# Patient Record
Sex: Female | Born: 1955
Health system: Southern US, Community
[De-identification: ages and names within clinical notes are randomized; demographics above are authoritative.]

## PROBLEM LIST (undated history)

## (undated) DIAGNOSIS — E785 Hyperlipidemia, unspecified: Secondary | ICD-10-CM

## (undated) DIAGNOSIS — I10 Essential (primary) hypertension: Secondary | ICD-10-CM

## (undated) HISTORY — DX: Hyperlipidemia, unspecified: E78.5

## (undated) HISTORY — DX: Essential (primary) hypertension: I10

## (undated) HISTORY — PX: BREAST BIOPSY: SHX20

## (undated) HISTORY — PX: BREAST EXCISIONAL BIOPSY: SUR124

---

## 1990-02-22 HISTORY — PX: BREAST SURGERY: SHX581

## 1997-12-10 ENCOUNTER — Other Ambulatory Visit: Admission: RE | Admit: 1997-12-10 | Discharge: 1997-12-10 | Payer: Self-pay | Admitting: Obstetrics and Gynecology

## 2000-09-15 ENCOUNTER — Other Ambulatory Visit: Admission: RE | Admit: 2000-09-15 | Discharge: 2000-09-15 | Payer: Self-pay | Admitting: Obstetrics and Gynecology

## 2002-11-15 ENCOUNTER — Encounter: Admission: RE | Admit: 2002-11-15 | Discharge: 2002-11-15 | Payer: Self-pay | Admitting: Obstetrics and Gynecology

## 2002-11-15 ENCOUNTER — Encounter: Payer: Self-pay | Admitting: Obstetrics and Gynecology

## 2002-12-13 ENCOUNTER — Other Ambulatory Visit: Admission: RE | Admit: 2002-12-13 | Discharge: 2002-12-13 | Payer: Self-pay | Admitting: *Deleted

## 2002-12-17 ENCOUNTER — Encounter: Admission: RE | Admit: 2002-12-17 | Discharge: 2002-12-17 | Payer: Self-pay | Admitting: *Deleted

## 2003-09-02 ENCOUNTER — Encounter: Admission: RE | Admit: 2003-09-02 | Discharge: 2003-09-02 | Payer: Self-pay | Admitting: Family Medicine

## 2004-12-21 ENCOUNTER — Ambulatory Visit: Payer: Self-pay | Admitting: Family Medicine

## 2005-12-21 ENCOUNTER — Encounter: Admission: RE | Admit: 2005-12-21 | Discharge: 2005-12-21 | Payer: Self-pay | Admitting: Family Medicine

## 2006-01-07 ENCOUNTER — Encounter: Payer: Self-pay | Admitting: Family Medicine

## 2006-02-10 ENCOUNTER — Encounter: Payer: Self-pay | Admitting: Family Medicine

## 2006-09-16 ENCOUNTER — Encounter: Payer: Self-pay | Admitting: Family Medicine

## 2006-12-22 ENCOUNTER — Encounter: Payer: Self-pay | Admitting: Family Medicine

## 2007-07-04 ENCOUNTER — Encounter: Admission: RE | Admit: 2007-07-04 | Discharge: 2007-07-04 | Payer: Self-pay | Admitting: Obstetrics and Gynecology

## 2007-10-12 ENCOUNTER — Encounter: Payer: Self-pay | Admitting: Family Medicine

## 2007-11-03 ENCOUNTER — Encounter: Payer: Self-pay | Admitting: Family Medicine

## 2008-01-12 ENCOUNTER — Encounter: Payer: Self-pay | Admitting: Family Medicine

## 2008-02-05 ENCOUNTER — Encounter: Payer: Self-pay | Admitting: Family Medicine

## 2008-07-23 ENCOUNTER — Encounter: Admission: RE | Admit: 2008-07-23 | Discharge: 2008-07-23 | Payer: Self-pay | Admitting: Obstetrics and Gynecology

## 2008-08-06 ENCOUNTER — Ambulatory Visit: Payer: Self-pay | Admitting: Family Medicine

## 2008-08-06 DIAGNOSIS — I10 Essential (primary) hypertension: Secondary | ICD-10-CM | POA: Insufficient documentation

## 2008-08-06 DIAGNOSIS — E785 Hyperlipidemia, unspecified: Secondary | ICD-10-CM | POA: Insufficient documentation

## 2008-08-19 ENCOUNTER — Encounter (INDEPENDENT_AMBULATORY_CARE_PROVIDER_SITE_OTHER): Payer: Self-pay | Admitting: *Deleted

## 2008-09-04 ENCOUNTER — Ambulatory Visit: Payer: Self-pay | Admitting: Family Medicine

## 2008-09-05 ENCOUNTER — Telehealth (INDEPENDENT_AMBULATORY_CARE_PROVIDER_SITE_OTHER): Payer: Self-pay | Admitting: *Deleted

## 2008-10-14 ENCOUNTER — Telehealth (INDEPENDENT_AMBULATORY_CARE_PROVIDER_SITE_OTHER): Payer: Self-pay | Admitting: *Deleted

## 2008-12-05 ENCOUNTER — Ambulatory Visit: Payer: Self-pay | Admitting: Family Medicine

## 2008-12-06 ENCOUNTER — Encounter: Payer: Self-pay | Admitting: Family Medicine

## 2008-12-06 LAB — CONVERTED CEMR LAB
Bilirubin, Direct: 0 mg/dL (ref 0.0–0.3)
CO2: 30 meq/L (ref 19–32)
Cholesterol: 197 mg/dL (ref 0–200)
GFR calc non Af Amer: 74.84 mL/min (ref >60)
Glucose, Bld: 96 mg/dL (ref 70–99)
LDL Cholesterol: 122 mg/dL — ABNORMAL HIGH (ref 0–99)
Potassium: 4.2 meq/L (ref 3.5–5.1)
Sodium: 139 meq/L (ref 135–145)
Total Bilirubin: 0.5 mg/dL (ref 0.3–1.2)
Total CHOL/HDL Ratio: 3
Total Protein: 6.5 g/dL (ref 6.0–8.3)

## 2008-12-20 ENCOUNTER — Telehealth (INDEPENDENT_AMBULATORY_CARE_PROVIDER_SITE_OTHER): Payer: Self-pay | Admitting: *Deleted

## 2008-12-24 ENCOUNTER — Telehealth: Payer: Self-pay | Admitting: Family Medicine

## 2009-02-11 ENCOUNTER — Ambulatory Visit: Payer: Self-pay | Admitting: Family Medicine

## 2009-02-11 DIAGNOSIS — H9319 Tinnitus, unspecified ear: Secondary | ICD-10-CM | POA: Insufficient documentation

## 2009-02-12 ENCOUNTER — Encounter (INDEPENDENT_AMBULATORY_CARE_PROVIDER_SITE_OTHER): Payer: Self-pay | Admitting: *Deleted

## 2009-02-12 LAB — CONVERTED CEMR LAB
ALT: 41 units/L — ABNORMAL HIGH (ref 0–35)
AST: 34 units/L (ref 0–37)
Albumin: 4.2 g/dL (ref 3.5–5.2)
Alkaline Phosphatase: 49 units/L (ref 39–117)
Basophils Absolute: 0 10*3/uL (ref 0.0–0.1)
Basophils Relative: 0.1 % (ref 0.0–3.0)
Bilirubin, Direct: 0 mg/dL (ref 0.0–0.3)
Chloride: 102 meq/L (ref 96–112)
Creatinine, Ser: 1 mg/dL (ref 0.4–1.2)
Direct LDL: 169.8 mg/dL
Eosinophils Absolute: 0.2 10*3/uL (ref 0.0–0.7)
Glucose, Bld: 102 mg/dL — ABNORMAL HIGH (ref 70–99)
HCT: 40.4 % (ref 36.0–46.0)
HDL: 66.3 mg/dL (ref >39.00)
Hemoglobin: 13.2 g/dL (ref 12.0–15.0)
Lymphocytes Relative: 27.9 % (ref 12.0–46.0)
Monocytes Absolute: 0.3 10*3/uL (ref 0.1–1.0)
Neutro Abs: 3.7 10*3/uL (ref 1.4–7.7)
Platelets: 232 10*3/uL (ref 150.0–400.0)
Potassium: 3.8 meq/L (ref 3.5–5.1)
RBC: 4.5 M/uL (ref 3.87–5.11)
Sodium: 140 meq/L (ref 135–145)
VLDL: 26.4 mg/dL (ref 0.0–40.0)
Vitamin B-12: 1081 pg/mL — ABNORMAL HIGH (ref 211–911)
WBC: 5.8 10*3/uL (ref 4.5–10.5)

## 2009-12-01 ENCOUNTER — Encounter: Payer: Self-pay | Admitting: Family Medicine

## 2009-12-01 ENCOUNTER — Encounter: Admission: RE | Admit: 2009-12-01 | Discharge: 2009-12-01 | Payer: Self-pay | Admitting: Family Medicine

## 2009-12-02 ENCOUNTER — Telehealth: Payer: Self-pay | Admitting: Family Medicine

## 2010-03-22 LAB — CONVERTED CEMR LAB
BUN: 11 mg/dL (ref 6–23)
Bilirubin, Direct: 0 mg/dL (ref 0.0–0.3)
CO2: 30 meq/L (ref 19–32)
Chloride: 109 meq/L (ref 96–112)
Creatinine, Ser: 1 mg/dL (ref 0.4–1.2)
GFR calc non Af Amer: 74.94 mL/min (ref >60)
Glucose, Bld: 103 mg/dL — ABNORMAL HIGH (ref 70–99)
Potassium: 4.3 meq/L (ref 3.5–5.1)
Total Bilirubin: 0.7 mg/dL (ref 0.3–1.2)

## 2010-03-24 NOTE — Progress Notes (Signed)
Summary: FYI   Phone Note Outgoing Call   Call placed by: Almeta Monas CMA Duncan Dull),  December 02, 2009 9:42 AM Call placed to: Patient Details for Reason: Medication Information/FYI Summary of Call: Called pt and advised that the manufacturer no longer recommends that the pt takes the 80mg  zocor and amlodipine together, adv pt we will change her to crestor instead, pt voiced understanding but stated that she just picked up her rx and does not have much information on Crestor, stated she wanted to wait until the 27th of october when she comes in for her appt to discuss with Dr.Lowne and doesn't want to change right now. Adv pt that this is a recommendation from the nmanufacter and a precaution due to possible side effects of the 2 meds. Pt voiced understanding and again decided to wait until her appt to discuss. Initial call taken by: Almeta Monas CMA Duncan Dull),  December 02, 2009 9:52 AM

## 2010-03-24 NOTE — Medication Information (Signed)
Summary: Interaction with Simvastatin & Amlodipine/[facility]  Interaction with Simvastatin & Amlodipine/[facility]   Imported By: Lanelle Bal 12/09/2009 14:50:00  _____________________________________________________________________  External Attachment:    Type:   Image     Comment:   External Document

## 2010-04-23 ENCOUNTER — Encounter (INDEPENDENT_AMBULATORY_CARE_PROVIDER_SITE_OTHER): Payer: Self-pay | Admitting: *Deleted

## 2010-04-30 NOTE — Letter (Signed)
Summary: Primary Care Appointment Letter  [facility]  [address] [place], [place] [address]   Phone: [phone]  Fax: [phone]    04/23/2010 MRN: [id number]     [facility] [address] [place], [place]  [address]      Dear Ms. Jed Limerick,   Your Primary Care Physician Loreen Freud DO has indicated that:    ___X____it is time to schedule an appointment for a physical with fasting labs.    _______you missed your appointment on______ and need to call and          reschedule.    _______you need to have lab work done.    _______you need to schedule an appointment discuss lab or test results.    _______you need to call to reschedule your appointment that is                       scheduled on _________.     Please call our office as soon as possible. Our phone number is [phone]. Please press option 1. Our office is open 8a-5p, Monday through Friday.     Thank you, [facility] Scheduler

## 2010-08-27 ENCOUNTER — Other Ambulatory Visit: Payer: Self-pay | Admitting: Family Medicine

## 2010-10-01 ENCOUNTER — Encounter: Payer: Self-pay | Admitting: Family Medicine

## 2010-10-01 ENCOUNTER — Ambulatory Visit (INDEPENDENT_AMBULATORY_CARE_PROVIDER_SITE_OTHER): Payer: BC Managed Care – PPO | Admitting: Family Medicine

## 2010-10-01 VITALS — BP 130/96 | HR 65 | Temp 98.7°F | Resp 18 | Ht 65.5 in | Wt 162.0 lb

## 2010-10-01 DIAGNOSIS — I10 Essential (primary) hypertension: Secondary | ICD-10-CM

## 2010-10-01 DIAGNOSIS — Z8249 Family history of ischemic heart disease and other diseases of the circulatory system: Secondary | ICD-10-CM

## 2010-10-01 DIAGNOSIS — Z0000 Encounter for general adult medical examination without abnormal findings: Secondary | ICD-10-CM

## 2010-10-01 DIAGNOSIS — R079 Chest pain, unspecified: Secondary | ICD-10-CM

## 2010-10-01 DIAGNOSIS — E785 Hyperlipidemia, unspecified: Secondary | ICD-10-CM

## 2010-10-01 MED ORDER — AMLODIPINE BESYLATE 10 MG PO TABS: 10.0000 mg | ORAL_TABLET | Freq: Every day | ORAL | Status: DC

## 2010-10-01 MED ORDER — NIACIN-SIMVASTATIN ER 1000-40 MG PO TB24: ORAL_TABLET | ORAL | Status: DC

## 2010-10-01 MED ORDER — NIACIN-SIMVASTATIN ER 500-40 MG PO TB24: ORAL_TABLET | ORAL | Status: DC

## 2010-10-01 NOTE — Patient Instructions (Signed)

## 2010-10-01 NOTE — Progress Notes (Signed)
  Subjective:     Kristine Mclaughlin is a 55 y.o. female and is here for a comprehensive physical exam. The patient reports no problems.  History   Social History  . Marital Status: Divorced    Spouse Name: N/A    Number of Children: N/A  . Years of Education: N/A   Occupational History  .  [employer]   Social History Main Topics  . Smoking status: Never Smoker   . Smokeless tobacco: Not on file  . Alcohol Use: 0.6 oz/week    1 Cans of beer per week  . Drug Use: No  . Sexually Active: Yes -- Female partner(s)   Other Topics Concern  . Not on file   Social History Narrative  . No narrative on file   Health Maintenance  Topic Date Due  . Influenza Vaccine  11/23/2010  . Pap Smear  10/01/2011  . Mammogram  12/02/2011  . Tetanus/tdap  01/11/2018  . Colonoscopy  10/01/2018    The following portions of the patient's history were reviewed and updated as appropriate: allergies, current medications, past family history, past medical history, past social history, past surgical history and problem list.  Review of Systems Review of Systems  Constitutional: Negative for activity change, appetite change and fatigue.  HENT: Negative for hearing loss, congestion, tinnitus and ear discharge.  dentist q32m Eyes: Negative for visual disturbance (see optho q1y -- vision corrected to 20/20 with contacts) Respiratory: Negative for cough, chest tightness and shortness of breath.   Cardiovascular: Negative for chest pain, palpitations and leg swelling.  Gastrointestinal: Negative for abdominal pain, diarrhea, constipation and abdominal distention.  Genitourinary: Negative for urgency, frequency, decreased urine volume and difficulty urinating.  Musculoskeletal: Negative for back pain, arthralgias and gait problem.  Skin: Negative for color change, pallor and rash.  Neurological: Negative for dizziness, light-headedness, numbness and headaches.  Hematological: Negative for adenopathy. Does not  bruise/bleed easily.  Psychiatric/Behavioral: Negative for suicidal ideas, confusion, sleep disturbance, self-injury, dysphoric mood, decreased concentration and agitation.       Objective:    BP 130/96  Pulse 65  Temp(Src) 98.7 F (37.1 C) (Oral)  Resp 18  Ht 5' 5.5" (1.664 m)  Wt 162 lb (73.483 kg)  BMI 26.55 kg/m2  SpO2 99% General appearance: alert, cooperative, appears stated age and no distress Head: Normocephalic, without obvious abnormality, atraumatic Eyes: conjunctivae/corneas clear. PERRL, EOM's intact. Fundi benign. Ears: normal TM's and external ear canals both ears Nose: Nares normal. Septum midline. Mucosa normal. No drainage or sinus tenderness. Throat: lips, mucosa, and tongue normal; teeth and gums normal Neck: no adenopathy, no carotid bruit, no JVD, supple, symmetrical, trachea midline and thyroid not enlarged, symmetric, no tenderness/mass/nodules Back: symmetric, no curvature. ROM normal. No CVA tenderness. Lungs: clear to auscultation bilaterally Breasts: gyn Heart: regular rate and rhythm, S1, S2 normal, no murmur, click, rub or gallop Abdomen: soft, non-tender; bowel sounds normal; no masses,  no organomegaly Pelvic: gyn Extremities: extremities normal, atraumatic, no cyanosis or edema Pulses: 2+ and symmetric Skin: Skin color, texture, turgor normal. No rashes or lesions Lymph nodes: Cervical, supraclavicular, and axillary nodes normal. Neurologic: Alert and oriented X 3, normal strength and tone. Normal symmetric reflexes. Normal coordination and gait    Assessment:    Healthy female exam. Hyperlipidemia htn     Plan:    check fasting labs ghm utd See After Visit Summary for Counseling Recommendations

## 2010-10-05 ENCOUNTER — Telehealth: Payer: Self-pay | Admitting: Family Medicine

## 2010-10-05 NOTE — Telephone Encounter (Signed)
In reference to your Order for Rest Stress Myoview, [employer] Nurse reviewer was unable to approve by phone.  A peer to peer is required using patient's ID# [id number], and calling [phone], Opt 2, and will stay open for only 3 business days.

## 2010-10-06 ENCOUNTER — Other Ambulatory Visit: Payer: Self-pay | Admitting: Family Medicine

## 2010-10-06 NOTE — Telephone Encounter (Signed)
Norvasc Last filled #[phone]  Please advise if Pt is to be on both med.

## 2010-10-06 NOTE — Telephone Encounter (Signed)
Did they say why they wouldn't cover it?  Will they cover a treadmill ?----I haven't been able to call during their preferred hours.  We can change it to treadmill if they will cover that.  I will not be in the office tomorrow.  Back on Thursday.

## 2010-10-08 ENCOUNTER — Other Ambulatory Visit (INDEPENDENT_AMBULATORY_CARE_PROVIDER_SITE_OTHER): Payer: BC Managed Care – PPO

## 2010-10-08 ENCOUNTER — Encounter: Payer: Self-pay | Admitting: Family Medicine

## 2010-10-08 DIAGNOSIS — R319 Hematuria, unspecified: Secondary | ICD-10-CM

## 2010-10-08 DIAGNOSIS — Z0000 Encounter for general adult medical examination without abnormal findings: Secondary | ICD-10-CM

## 2010-10-08 LAB — CBC WITH DIFFERENTIAL/PLATELET
Basophils Relative: 0.4 % (ref 0.0–3.0)
Eosinophils Absolute: 0.1 10*3/uL (ref 0.0–0.7)
Hemoglobin: 12.8 g/dL (ref 12.0–15.0)
MCHC: 32.5 g/dL (ref 30.0–36.0)
Neutrophils Relative %: 64.9 % (ref 43.0–77.0)
WBC: 5.3 10*3/uL (ref 4.5–10.5)

## 2010-10-08 LAB — BASIC METABOLIC PANEL
BUN: 11 mg/dL (ref 6–23)
CO2: 27 mEq/L (ref 19–32)
Calcium: 8.9 mg/dL (ref 8.4–10.5)
Creatinine, Ser: 0.8 mg/dL (ref 0.4–1.2)
GFR: 100.47 mL/min (ref >60.00)
Glucose, Bld: 104 mg/dL — ABNORMAL HIGH (ref 70–99)
Sodium: 137 mEq/L (ref 135–145)

## 2010-10-08 LAB — POCT URINALYSIS DIPSTICK
Bilirubin, UA: NEGATIVE
Ketones, UA: NEGATIVE
Leukocytes, UA: NEGATIVE
Protein, UA: NEGATIVE
Spec Grav, UA: 1.02

## 2010-10-08 LAB — HEPATIC FUNCTION PANEL
ALT: 20 U/L (ref 0–35)
AST: 20 U/L (ref 0–37)
Albumin: 4.4 g/dL (ref 3.5–5.2)
Bilirubin, Direct: <0.1 mg/dL (ref 0.0–0.3)
Total Bilirubin: 0.5 mg/dL (ref 0.3–1.2)
Total Protein: 7.3 g/dL (ref 6.0–8.3)

## 2010-10-08 LAB — LIPID PANEL
Cholesterol: 226 mg/dL — ABNORMAL HIGH (ref 0–200)
HDL: 70.2 mg/dL (ref >39.00)
Total CHOL/HDL Ratio: 3
Triglycerides: 94 mg/dL (ref 0.0–149.0)

## 2010-10-08 LAB — LDL CHOLESTEROL, DIRECT: Direct LDL: 165.3 mg/dL

## 2010-10-08 NOTE — Progress Notes (Signed)
Labs only

## 2010-10-08 NOTE — Telephone Encounter (Signed)
Rx sent 

## 2010-10-09 ENCOUNTER — Other Ambulatory Visit: Payer: Self-pay | Admitting: Family Medicine

## 2010-10-09 ENCOUNTER — Encounter: Payer: Self-pay | Admitting: Family Medicine

## 2010-10-09 DIAGNOSIS — Z8249 Family history of ischemic heart disease and other diseases of the circulatory system: Secondary | ICD-10-CM

## 2010-10-09 NOTE — Telephone Encounter (Signed)
No Pre-Cert required for Treadmill (Ref# [id number]).  Please enter Order as EXC or Treadmill, should pull up the correct test.

## 2010-10-12 ENCOUNTER — Other Ambulatory Visit: Payer: Self-pay | Admitting: Family Medicine

## 2010-10-12 DIAGNOSIS — Z8249 Family history of ischemic heart disease and other diseases of the circulatory system: Secondary | ICD-10-CM

## 2010-10-13 ENCOUNTER — Encounter: Payer: Self-pay | Admitting: Family Medicine

## 2010-10-15 ENCOUNTER — Encounter: Payer: Self-pay | Admitting: Family Medicine

## 2010-10-15 ENCOUNTER — Ambulatory Visit (INDEPENDENT_AMBULATORY_CARE_PROVIDER_SITE_OTHER): Payer: BC Managed Care – PPO | Admitting: Family Medicine

## 2010-10-15 VITALS — BP 116/82 | HR 83 | Temp 98.4°F | Wt 161.6 lb

## 2010-10-15 DIAGNOSIS — I10 Essential (primary) hypertension: Secondary | ICD-10-CM

## 2010-10-15 DIAGNOSIS — G2581 Restless legs syndrome: Secondary | ICD-10-CM

## 2010-10-15 MED ORDER — BENAZEPRIL HCL 20 MG PO TABS: 20.0000 mg | ORAL_TABLET | Freq: Every day | ORAL | Status: DC

## 2010-10-15 MED ORDER — ROPINIROLE HCL 0.25 MG PO TABS: ORAL_TABLET | ORAL | Status: DC

## 2010-10-15 MED ORDER — AMLODIPINE BESYLATE 10 MG PO TABS: 10.0000 mg | ORAL_TABLET | Freq: Every day | ORAL | Status: DC

## 2010-10-15 NOTE — Patient Instructions (Signed)

## 2010-10-15 NOTE — Progress Notes (Signed)
  Subjective:    Patient here for follow-up of elevated blood pressure.  She is exercising and is adherent to a low-salt diet.  Blood pressure is well controlled at home. Cardiac symptoms: none. Patient denies: chest pain, chest pressure/discomfort, claudication, dyspnea, exertional chest pressure/discomfort, fatigue, irregular heart beat, lower extremity edema, near-syncope, orthopnea, palpitations, paroxysmal nocturnal dyspnea, syncope and tachypnea. Cardiovascular risk factors: dyslipidemia, family history of premature cardiovascular disease and hypertension. Use of agents associated with hypertension: none. History of target organ damage: none.  The following portions of the patient's history were reviewed and updated as appropriate: allergies, current medications, past family history, past medical history, past social history, past surgical history and problem list.  Review of Systems Pertinent items are noted in HPI.     Objective:    BP 116/82  Pulse 83  Temp(Src) 98.4 F (36.9 C) (Oral)  Wt 161 lb 9.6 oz (73.301 kg)  SpO2 98% General appearance: alert, cooperative, appears stated age and no distress Lungs: clear to auscultation bilaterally Heart: regular rate and rhythm, S1, S2 normal, no murmur, click, rub or gallop Extremities: extremities normal, atraumatic, no cyanosis or edema    Assessment:    Hypertension, normal blood pressure . Evidence of target organ damage: none.    Plan:    Medication: no change. Dietary sodium restriction. Regular aerobic exercise. Follow up: 3 months and as needed.

## 2010-10-20 NOTE — Progress Notes (Signed)
mssg left on VM     KP 

## 2010-10-27 ENCOUNTER — Telehealth: Payer: Self-pay | Admitting: Family Medicine

## 2010-10-27 NOTE — Telephone Encounter (Signed)
was this an Error?

## 2010-10-27 NOTE — Telephone Encounter (Signed)
I'm sorry I can't tell what this is asking for.

## 2010-10-29 ENCOUNTER — Other Ambulatory Visit: Payer: BC Managed Care – PPO

## 2010-10-29 ENCOUNTER — Encounter: Payer: BC Managed Care – PPO | Admitting: Physician Assistant

## 2010-11-03 ENCOUNTER — Other Ambulatory Visit: Payer: Self-pay | Admitting: *Deleted

## 2010-11-03 ENCOUNTER — Telehealth: Payer: Self-pay | Admitting: *Deleted

## 2010-11-03 NOTE — Telephone Encounter (Signed)
Prior auth approved 10-30-10-07-25-13, pharmacy notified via fax.

## 2010-11-03 NOTE — Telephone Encounter (Signed)
Pharmacy called informing dr Laury Axon that per recommendation simcor 1000-20 is the reccommended dose when Pt is on norvasc. Pt is currently being Rx simcor 1000-40. Please advise

## 2010-11-03 NOTE — Telephone Encounter (Signed)
It was my understanding that if the pt has been on that combo for more than a year its ok to leave it---i actually lowered zocor from 80 to 40.

## 2010-11-04 ENCOUNTER — Other Ambulatory Visit: Payer: Self-pay | Admitting: Family Medicine

## 2010-11-04 DIAGNOSIS — N390 Urinary tract infection, site not specified: Secondary | ICD-10-CM

## 2010-11-04 NOTE — Telephone Encounter (Signed)
Discussed with Pharmacist and she voiced understanding of Dr.Lowne recommendations     KP

## 2010-11-05 ENCOUNTER — Other Ambulatory Visit (INDEPENDENT_AMBULATORY_CARE_PROVIDER_SITE_OTHER): Payer: BC Managed Care – PPO

## 2010-11-05 DIAGNOSIS — N390 Urinary tract infection, site not specified: Secondary | ICD-10-CM

## 2010-11-05 LAB — POCT URINALYSIS DIPSTICK
Glucose, UA: NEGATIVE
Spec Grav, UA: 1.005
Urobilinogen, UA: 0.2
pH, UA: 7.5

## 2010-11-05 NOTE — Progress Notes (Signed)
Labs only

## 2010-11-06 NOTE — Progress Notes (Signed)
Labs only

## 2010-11-07 LAB — URINE CULTURE

## 2010-11-17 ENCOUNTER — Encounter: Payer: Self-pay | Admitting: Family Medicine

## 2010-11-20 ENCOUNTER — Telehealth: Payer: Self-pay

## 2010-11-20 MED ORDER — ATORVASTATIN CALCIUM 20 MG PO TABS: 20.0000 mg | ORAL_TABLET | Freq: Every day | ORAL | Status: DC

## 2010-11-20 NOTE — Telephone Encounter (Signed)
mssg from patient stating that Simcor was too expensive, she stated even after using the discount card she will be paying 167 dollars and that is too much money for this med, she wants to know if she could change to something else. Please advise     KP

## 2010-11-20 NOTE — Telephone Encounter (Signed)
Lipitor 20 mg #30  1 po qhs, 2 refills 

## 2010-11-20 NOTE — Telephone Encounter (Signed)
VM left advising patient new Rx has been sent to the pharmacy     KP

## 2011-01-13 ENCOUNTER — Encounter: Payer: Self-pay | Admitting: Family Medicine

## 2011-01-21 ENCOUNTER — Ambulatory Visit: Payer: BC Managed Care – PPO | Admitting: Family Medicine

## 2011-02-18 ENCOUNTER — Other Ambulatory Visit: Payer: Self-pay | Admitting: Family Medicine

## 2011-04-01 ENCOUNTER — Encounter: Payer: Self-pay | Admitting: Family Medicine

## 2011-04-01 ENCOUNTER — Ambulatory Visit (INDEPENDENT_AMBULATORY_CARE_PROVIDER_SITE_OTHER): Payer: BC Managed Care – PPO | Admitting: Family Medicine

## 2011-04-01 VITALS — BP 114/70 | HR 71 | Temp 98.4°F | Wt 162.0 lb

## 2011-04-01 DIAGNOSIS — E785 Hyperlipidemia, unspecified: Secondary | ICD-10-CM

## 2011-04-01 DIAGNOSIS — R739 Hyperglycemia, unspecified: Secondary | ICD-10-CM

## 2011-04-01 DIAGNOSIS — R319 Hematuria, unspecified: Secondary | ICD-10-CM

## 2011-04-01 DIAGNOSIS — R7309 Other abnormal glucose: Secondary | ICD-10-CM

## 2011-04-01 DIAGNOSIS — I10 Essential (primary) hypertension: Secondary | ICD-10-CM

## 2011-04-01 LAB — HEPATIC FUNCTION PANEL
AST: 20 U/L (ref 0–37)
Albumin: 4.1 g/dL (ref 3.5–5.2)
Alkaline Phosphatase: 56 U/L (ref 39–117)
Total Protein: 7.3 g/dL (ref 6.0–8.3)

## 2011-04-01 LAB — BASIC METABOLIC PANEL
BUN: 12 mg/dL (ref 6–23)
Creatinine, Ser: 1 mg/dL (ref 0.4–1.2)
GFR: 78.7 mL/min (ref >60.00)
Glucose, Bld: 107 mg/dL — ABNORMAL HIGH (ref 70–99)
Potassium: 4.1 mEq/L (ref 3.5–5.1)

## 2011-04-01 LAB — LDL CHOLESTEROL, DIRECT: Direct LDL: 165.3 mg/dL

## 2011-04-01 LAB — LIPID PANEL
Cholesterol: 244 mg/dL — ABNORMAL HIGH (ref 0–200)
HDL: 72.3 mg/dL (ref >39.00)

## 2011-04-01 LAB — POCT URINALYSIS DIPSTICK
Glucose, UA: NEGATIVE
Ketones, UA: NEGATIVE

## 2011-04-01 NOTE — Patient Instructions (Signed)

## 2011-04-01 NOTE — Progress Notes (Signed)
  Subjective:    Patient here for follow-up of elevated blood pressure.  She is exercising and is adherent to a low-salt diet.  Blood pressure is well controlled at home. Cardiac symptoms: none. Patient denies: chest pain, chest pressure/discomfort, claudication, dyspnea, exertional chest pressure/discomfort, fatigue, irregular heart beat, lower extremity edema, near-syncope, orthopnea, palpitations, paroxysmal nocturnal dyspnea, syncope and tachypnea. Cardiovascular risk factors: none. Use of agents associated with hypertension: none. History of target organ damage: none.  The following portions of the patient's history were reviewed and updated as appropriate: allergies, current medications, past family history, past medical history, past social history, past surgical history and problem list.  Review of Systems Pertinent items are noted in HPI.     Objective:    BP 114/70  Pulse 71  Temp(Src) 98.4 F (36.9 C) (Oral)  Wt 162 lb (73.483 kg)  SpO2 98% General appearance: alert, cooperative, appears stated age and no distress Lungs: clear to auscultation bilaterally Heart: S1, S2 normal Extremities: extremities normal, atraumatic, no cyanosis or edema    Assessment:    Hypertension, normal blood pressure . Evidence of target organ damage: none.   hyperlipidemia-- check labs con't meds  hyperglycemia--- con't watch diet and exercise,  Check labs Plan:    Medication: no change. Dietary sodium restriction. Regular aerobic exercise. Check blood pressures 2-3 times weekly and record. Follow up: 6 months and as needed.

## 2011-04-01 NOTE — Progress Notes (Signed)
Addended by: Legrand Como on: 04/01/2011 11:08 AM   Modules accepted: Orders

## 2011-04-03 LAB — URINE CULTURE
Colony Count: NO GROWTH
Organism ID, Bacteria: NO GROWTH

## 2011-04-07 MED ORDER — ATORVASTATIN CALCIUM 40 MG PO TABS: 40.0000 mg | ORAL_TABLET | Freq: Every day | ORAL | Status: DC

## 2011-07-17 ENCOUNTER — Other Ambulatory Visit: Payer: Self-pay | Admitting: Family Medicine

## 2011-07-20 ENCOUNTER — Other Ambulatory Visit: Payer: Self-pay

## 2011-07-20 MED ORDER — ATORVASTATIN CALCIUM 40 MG PO TABS: 40.0000 mg | ORAL_TABLET | Freq: Every day | ORAL | Status: DC

## 2011-08-16 ENCOUNTER — Other Ambulatory Visit: Payer: Self-pay | Admitting: Family Medicine

## 2011-08-31 ENCOUNTER — Telehealth: Payer: Self-pay | Admitting: Family Medicine

## 2011-08-31 DIAGNOSIS — E785 Hyperlipidemia, unspecified: Secondary | ICD-10-CM

## 2011-08-31 DIAGNOSIS — Z1231 Encounter for screening mammogram for malignant neoplasm of breast: Secondary | ICD-10-CM

## 2011-08-31 NOTE — Telephone Encounter (Signed)
msg left to be sure that patient was not having any breast problems before ordering Mammogram.     KP

## 2011-08-31 NOTE — Telephone Encounter (Signed)
labs due coming Thursday - Recheck labs in 3 months---.272.4 Lipid, hep, (can you put in orders) patient also would like a referral for a mammogram as well. OK To leave a detailed message on cell # [phone]

## 2011-09-02 ENCOUNTER — Ambulatory Visit
Admission: RE | Admit: 2011-09-02 | Discharge: 2011-09-02 | Disposition: A | Discharge status: Home / Self Care | Payer: BC Managed Care – PPO | Source: Ambulatory Visit | Attending: Family Medicine | Admitting: Family Medicine

## 2011-09-02 ENCOUNTER — Other Ambulatory Visit (INDEPENDENT_AMBULATORY_CARE_PROVIDER_SITE_OTHER): Payer: BC Managed Care – PPO

## 2011-09-02 DIAGNOSIS — E785 Hyperlipidemia, unspecified: Secondary | ICD-10-CM

## 2011-09-02 DIAGNOSIS — Z1231 Encounter for screening mammogram for malignant neoplasm of breast: Secondary | ICD-10-CM

## 2011-09-02 LAB — LIPID PANEL
Cholesterol: 229 mg/dL — ABNORMAL HIGH (ref 0–200)
HDL: 79.9 mg/dL (ref >39.00)
Triglycerides: 83 mg/dL (ref 0.0–149.0)
VLDL: 16.6 mg/dL (ref 0.0–40.0)

## 2011-09-02 LAB — HEPATIC FUNCTION PANEL
Albumin: 4.3 g/dL (ref 3.5–5.2)
Total Protein: 7.6 g/dL (ref 6.0–8.3)

## 2011-09-16 ENCOUNTER — Other Ambulatory Visit: Payer: Self-pay | Admitting: Family Medicine

## 2011-12-13 ENCOUNTER — Other Ambulatory Visit: Payer: Self-pay | Admitting: Family Medicine

## 2011-12-13 DIAGNOSIS — I10 Essential (primary) hypertension: Secondary | ICD-10-CM

## 2011-12-13 MED ORDER — BENAZEPRIL HCL 20 MG PO TABS: 20.0000 mg | ORAL_TABLET | Freq: Every day | ORAL | Status: DC

## 2011-12-13 MED ORDER — AMLODIPINE BESYLATE 10 MG PO TABS: 10.0000 mg | ORAL_TABLET | Freq: Every day | ORAL | Status: DC

## 2011-12-13 NOTE — Telephone Encounter (Signed)
REFILLS X 2---last ov 2.7.13-follow up 6-months  1-Benazepril HCl (Tab) 20 MG Take 1 tablet (20 mg total) by mouth daily. #90 last fill 3.30.13  2-AmLODIPine Besylate (Tab) 10 MG Take 1 tablet (10 mg total) by mouth daily #90--last fill 03.30.13

## 2012-01-13 ENCOUNTER — Telehealth: Payer: Self-pay | Admitting: Family Medicine

## 2012-01-13 NOTE — Telephone Encounter (Signed)
Refill-atorvastatin 40mg  tablet. Take one tablet by mouth daily. Qty 30 last fill 11.20.13

## 2012-03-20 ENCOUNTER — Other Ambulatory Visit: Payer: Self-pay | Admitting: Family Medicine

## 2012-03-20 NOTE — Telephone Encounter (Signed)
Last seen 04/01/11 and labs done 09/02/11, No pending apts.      KP

## 2012-03-20 NOTE — Telephone Encounter (Signed)
Pt needs ov. 

## 2012-03-20 NOTE — Telephone Encounter (Signed)
refill  Atorvastatin (Tab) 40 MG Take 1 tablet (40 mg total) by mouth daily #30 last fill 12.26.13

## 2012-03-20 NOTE — Telephone Encounter (Signed)
Msg left for a return call.    KP 

## 2012-03-24 MED ORDER — ATORVASTATIN CALCIUM 40 MG PO TABS: 40.0000 mg | ORAL_TABLET | Freq: Every day | ORAL | Status: DC

## 2012-04-08 ENCOUNTER — Other Ambulatory Visit: Payer: Self-pay

## 2012-04-18 ENCOUNTER — Other Ambulatory Visit: Payer: Self-pay | Admitting: Family Medicine

## 2012-04-22 ENCOUNTER — Other Ambulatory Visit: Payer: Self-pay | Admitting: Family Medicine

## 2012-05-22 ENCOUNTER — Other Ambulatory Visit: Payer: Self-pay | Admitting: Family Medicine

## 2012-06-15 ENCOUNTER — Encounter: Payer: BC Managed Care – PPO | Admitting: Family Medicine

## 2012-06-15 ENCOUNTER — Encounter: Payer: Self-pay | Admitting: Family Medicine

## 2012-06-15 ENCOUNTER — Ambulatory Visit (INDEPENDENT_AMBULATORY_CARE_PROVIDER_SITE_OTHER): Payer: BC Managed Care – PPO | Admitting: Family Medicine

## 2012-06-15 VITALS — BP 118/84 | HR 86 | Temp 98.6°F | Wt 165.6 lb

## 2012-06-15 DIAGNOSIS — Z0000 Encounter for general adult medical examination without abnormal findings: Secondary | ICD-10-CM

## 2012-06-15 DIAGNOSIS — E785 Hyperlipidemia, unspecified: Secondary | ICD-10-CM

## 2012-06-15 DIAGNOSIS — I10 Essential (primary) hypertension: Secondary | ICD-10-CM

## 2012-06-15 DIAGNOSIS — R319 Hematuria, unspecified: Secondary | ICD-10-CM

## 2012-06-15 LAB — CBC WITH DIFFERENTIAL/PLATELET
Basophils Absolute: 0 10*3/uL (ref 0.0–0.1)
Eosinophils Relative: 3.1 % (ref 0.0–5.0)
MCV: 86.3 fl (ref 78.0–100.0)
Monocytes Absolute: 0.3 10*3/uL (ref 0.1–1.0)
Neutrophils Relative %: 56.8 % (ref 43.0–77.0)
Platelets: 250 10*3/uL (ref 150.0–400.0)
RDW: 13.7 % (ref 11.5–14.6)
WBC: 5.8 10*3/uL (ref 4.5–10.5)

## 2012-06-15 LAB — BASIC METABOLIC PANEL
BUN: 13 mg/dL (ref 6–23)
Chloride: 105 mEq/L (ref 96–112)
Creatinine, Ser: 0.9 mg/dL (ref 0.4–1.2)
GFR: 79.32 mL/min (ref >60.00)

## 2012-06-15 LAB — POCT URINALYSIS DIPSTICK
Glucose, UA: NEGATIVE
Ketones, UA: NEGATIVE
Spec Grav, UA: 1.02

## 2012-06-15 LAB — HEPATIC FUNCTION PANEL
ALT: 25 U/L (ref 0–35)
Bilirubin, Direct: 0 mg/dL (ref 0.0–0.3)
Total Bilirubin: 0.3 mg/dL (ref 0.3–1.2)

## 2012-06-15 LAB — TSH: TSH: 0.38 u[IU]/mL (ref 0.35–5.50)

## 2012-06-15 MED ORDER — ATORVASTATIN CALCIUM 40 MG PO TABS: 40.0000 mg | ORAL_TABLET | Freq: Every day | ORAL | Status: DC

## 2012-06-15 MED ORDER — BENAZEPRIL HCL 20 MG PO TABS: 20.0000 mg | ORAL_TABLET | Freq: Every day | ORAL | Status: DC

## 2012-06-15 NOTE — Addendum Note (Signed)
Addended by: Silvio Pate D on: 06/15/2012 03:13 PM   Modules accepted: Orders

## 2012-06-15 NOTE — Progress Notes (Signed)
  Subjective:    Patient ID: Kristine Mclaughlin, female    DOB: 05-31-1955, 57 y.o.   MRN: [id number]  HPI  No charge-- lab only appointment--physical next month   Review of Systems     Objective:   Physical Exam        Assessment & Plan:

## 2012-06-17 LAB — URINE CULTURE: Colony Count: >=100000

## 2012-06-22 ENCOUNTER — Other Ambulatory Visit: Payer: Self-pay

## 2012-06-22 MED ORDER — CIPROFLOXACIN HCL 500 MG PO TABS: 500.0000 mg | ORAL_TABLET | Freq: Two times a day (BID) | ORAL | Status: DC

## 2012-07-11 ENCOUNTER — Encounter: Payer: BC Managed Care – PPO | Admitting: Family Medicine

## 2012-07-13 ENCOUNTER — Other Ambulatory Visit: Payer: Self-pay | Admitting: Family Medicine

## 2012-07-26 ENCOUNTER — Other Ambulatory Visit: Payer: Self-pay | Admitting: Family Medicine

## 2012-08-24 ENCOUNTER — Ambulatory Visit (INDEPENDENT_AMBULATORY_CARE_PROVIDER_SITE_OTHER): Payer: BC Managed Care – PPO | Admitting: Family Medicine

## 2012-08-24 ENCOUNTER — Other Ambulatory Visit (HOSPITAL_COMMUNITY)
Admission: RE | Admit: 2012-08-24 | Discharge: 2012-08-24 | Disposition: A | Discharge status: Home / Self Care | Payer: BC Managed Care – PPO | Source: Ambulatory Visit | Attending: Family Medicine | Admitting: Family Medicine

## 2012-08-24 ENCOUNTER — Encounter: Payer: Self-pay | Admitting: Family Medicine

## 2012-08-24 VITALS — BP 114/74 | HR 78 | Temp 98.5°F | Ht 65.5 in | Wt 165.4 lb

## 2012-08-24 DIAGNOSIS — Z01419 Encounter for gynecological examination (general) (routine) without abnormal findings: Secondary | ICD-10-CM | POA: Insufficient documentation

## 2012-08-24 DIAGNOSIS — Z0000 Encounter for general adult medical examination without abnormal findings: Secondary | ICD-10-CM

## 2012-08-24 DIAGNOSIS — Z124 Encounter for screening for malignant neoplasm of cervix: Secondary | ICD-10-CM

## 2012-08-24 DIAGNOSIS — R7309 Other abnormal glucose: Secondary | ICD-10-CM

## 2012-08-24 DIAGNOSIS — R739 Hyperglycemia, unspecified: Secondary | ICD-10-CM

## 2012-08-24 DIAGNOSIS — I10 Essential (primary) hypertension: Secondary | ICD-10-CM

## 2012-08-24 DIAGNOSIS — E785 Hyperlipidemia, unspecified: Secondary | ICD-10-CM

## 2012-08-24 LAB — HEMOGLOBIN A1C: Hgb A1c MFr Bld: 6.1 % (ref 4.6–6.5)

## 2012-08-24 LAB — HEPATIC FUNCTION PANEL
Bilirubin, Direct: 0 mg/dL (ref 0.0–0.3)
Total Bilirubin: 0.5 mg/dL (ref 0.3–1.2)
Total Protein: 7.7 g/dL (ref 6.0–8.3)

## 2012-08-24 LAB — BASIC METABOLIC PANEL
Calcium: 9.3 mg/dL (ref 8.4–10.5)
GFR: 83.34 mL/min (ref >60.00)
Sodium: 139 mEq/L (ref 135–145)

## 2012-08-24 LAB — LIPID PANEL
HDL: 78.6 mg/dL (ref >39.00)
Total CHOL/HDL Ratio: 3
Triglycerides: 67 mg/dL (ref 0.0–149.0)

## 2012-08-24 LAB — LDL CHOLESTEROL, DIRECT: Direct LDL: 158.8 mg/dL

## 2012-08-24 NOTE — Progress Notes (Signed)
Subjective:     Kristine Mclaughlin is a 57 y.o. female and is here for a comprehensive physical exam. The patient reports no problems.  History   Social History  . Marital Status: Divorced    Spouse Name: N/A    Number of Children: N/A  . Years of Education: N/A   Occupational History  .  [employer]   Social History Main Topics  . Smoking status: Never Smoker   . Smokeless tobacco: Not on file  . Alcohol Use: 0.6 oz/week    1 Cans of beer per week  . Drug Use: No  . Sexually Active: Yes -- Female partner(s)   Other Topics Concern  . Not on file   Social History Narrative   Exercise-- walks daily 3 miles   Health Maintenance  Topic Date Due  . Influenza Vaccine  10/23/2012  . Mammogram  09/01/2013  . Pap Smear  08/25/2015  . Tetanus/tdap  01/11/2018  . Colonoscopy  10/01/2018    The following portions of the patient's history were reviewed and updated as appropriate:  She  has a past medical history of Hyperlipidemia and Hypertension. She  does not have any pertinent problems on file. She  has past surgical history that includes Breast surgery (1992). Her family history includes Coronary artery disease in an unspecified family member; Diabetes in her brother and father; Heart disease (age of onset: 28) in her brother; Heart disease (age of onset: 32) in her father; Hyperlipidemia in her brother and father; and Hypertension in her brother and father. She  reports that she has never smoked. She does not have any smokeless tobacco history on file. She reports that she drinks about 0.6 ounces of alcohol per week. She reports that she does not use illicit drugs. She has a current medication list which includes the following prescription(s): amlodipine, atorvastatin, benazepril, calcium-vitamin d, fish oil-omega-3 fatty acids, multivitamin, and multivitamin-lutein. Current Outpatient Prescriptions on File Prior to Visit  Medication Sig Dispense Refill  . amLODipine (NORVASC)  10 MG tablet TAKE 1 TABLET (10 MG TOTAL) BY MOUTH DAILY.  90 tablet  1  . atorvastatin (LIPITOR) 40 MG tablet Take 1 tablet (40 mg total) by mouth daily.  30 tablet  5  . benazepril (LOTENSIN) 20 MG tablet TAKE 1 TABLET (20 MG TOTAL) BY MOUTH DAILY.  90 tablet  1  . Calcium Carbonate-Vitamin D (CALCIUM-VITAMIN D) 500-200 MG-UNIT per tablet Take 1 tablet by mouth 3 (three) times daily with meals.        . fish oil-omega-3 fatty acids 1000 MG capsule Take 1 g by mouth daily.       No current facility-administered medications on file prior to visit.   She has No Known Allergies..  Review of Systems Review of Systems  Constitutional: Negative for activity change, appetite change and fatigue.  HENT: Negative for hearing loss, congestion, tinnitus and ear discharge.  dentist q25m Eyes: Negative for visual disturbance (see optho q1y -- vision corrected to 20/20 with glasses).  Respiratory: Negative for cough, chest tightness and shortness of breath.   Cardiovascular: Negative for chest pain, palpitations and leg swelling.  Gastrointestinal: Negative for abdominal pain, diarrhea, constipation and abdominal distention.  Genitourinary: Negative for urgency, frequency, decreased urine volume and difficulty urinating.  Musculoskeletal: Negative for back pain, arthralgias and gait problem.  Skin: Negative for color change, pallor and rash.  Neurological: Negative for dizziness, light-headedness, numbness and headaches.  Hematological: Negative for adenopathy. Does not bruise/bleed  easily.  Psychiatric/Behavioral: Negative for suicidal ideas, confusion, sleep disturbance, self-injury, dysphoric mood, decreased concentration and agitation.       Objective:    BP 114/74  Pulse 78  Temp(Src) 98.5 F (36.9 C) (Oral)  Ht 5' 5.5" (1.664 m)  Wt 165 lb 6.4 oz (75.025 kg)  BMI 27.1 kg/m2  SpO2 97% General appearance: alert, cooperative, appears stated age and no distress Head: Normocephalic, without  obvious abnormality, atraumatic Eyes: conjunctivae/corneas clear. PERRL, EOM's intact. Fundi benign. Ears: normal TM's and external ear canals both ears Nose: Nares normal. Septum midline. Mucosa normal. No drainage or sinus tenderness. Throat: lips, mucosa, and tongue normal; teeth and gums normal Neck: no adenopathy, no carotid bruit, no JVD, supple, symmetrical, trachea midline and thyroid not enlarged, symmetric, no tenderness/mass/nodules Back: symmetric, no curvature. ROM normal. No CVA tenderness. Lungs: clear to auscultation bilaterally Breasts: normal appearance, no masses or tenderness Heart: S1, S2 normal Abdomen: soft, non-tender; bowel sounds normal; no masses,  no organomegaly Pelvic: cervix normal in appearance, external genitalia normal, no adnexal masses or tenderness, no cervical motion tenderness, rectovaginal septum normal, uterus normal size, shape, and consistency, vagina normal without discharge and pap done Extremities: extremities normal, atraumatic, no cyanosis or edema Pulses: 2+ and symmetric Skin: Skin color, texture, turgor normal. No rashes or lesions Lymph nodes: Cervical, supraclavicular, and axillary nodes normal. Neurologic: Alert and oriented X 3, normal strength and tone. Normal symmetric reflexes. Normal coordination and gait Psych== no depression, no anxiety      Assessment:    Healthy female exam.      Plan:    ghm utd Check labs See After Visit Summary for Counseling Recommendations

## 2012-08-24 NOTE — Assessment & Plan Note (Signed)
Check labs Stable con't meds 

## 2012-08-24 NOTE — Assessment & Plan Note (Signed)
Check labs con't meds 

## 2012-08-24 NOTE — Patient Instructions (Signed)
Preventive Care for Adults, Female A healthy lifestyle and preventive care can promote health and wellness. Preventive health guidelines for women include the following key practices.  A routine yearly physical is a good way to check with your caregiver about your health and preventive screening. It is a chance to share any concerns and updates on your health, and to receive a thorough exam.  Visit your dentist for a routine exam and preventive care every 6 months. Brush your teeth twice a day and floss once a day. Good oral hygiene prevents tooth decay and gum disease.  The frequency of eye exams is based on your age, health, family medical history, use of contact lenses, and other factors. Follow your caregiver's recommendations for frequency of eye exams.  Eat a healthy diet. Foods like vegetables, fruits, whole grains, low-fat dairy products, and lean protein foods contain the nutrients you need without too many calories. Decrease your intake of foods high in solid fats, added sugars, and salt. Eat the right amount of calories for you.Get information about a proper diet from your caregiver, if necessary.  Regular physical exercise is one of the most important things you can do for your health. Most adults should get at least 150 minutes of moderate-intensity exercise (any activity that increases your heart rate and causes you to sweat) each week. In addition, most adults need muscle-strengthening exercises on 2 or more days a week.  Maintain a healthy weight. The body mass index (BMI) is a screening tool to identify possible weight problems. It provides an estimate of body fat based on height and weight. Your caregiver can help determine your BMI, and can help you achieve or maintain a healthy weight.For adults 20 years and older:  A BMI below 18.5 is considered underweight.  A BMI of 18.5 to 24.9 is normal.  A BMI of 25 to 29.9 is considered overweight.  A BMI of 30 and above is  considered obese.  Maintain normal blood lipids and cholesterol levels by exercising and minimizing your intake of saturated fat. Eat a balanced diet with plenty of fruit and vegetables. Blood tests for lipids and cholesterol should begin at age 20 and be repeated every 5 years. If your lipid or cholesterol levels are high, you are over 50, or you are at high risk for heart disease, you may need your cholesterol levels checked more frequently.Ongoing high lipid and cholesterol levels should be treated with medicines if diet and exercise are not effective.  If you smoke, find out from your caregiver how to quit. If you do not use tobacco, do not start.  If you are pregnant, do not drink alcohol. If you are breastfeeding, be very cautious about drinking alcohol. If you are not pregnant and choose to drink alcohol, do not exceed 1 drink per day. One drink is considered to be 12 ounces (355 mL) of beer, 5 ounces (148 mL) of wine, or 1.5 ounces (44 mL) of liquor.  Avoid use of street drugs. Do not share needles with anyone. Ask for help if you need support or instructions about stopping the use of drugs.  High blood pressure causes heart disease and increases the risk of stroke. Your blood pressure should be checked at least every 1 to 2 years. Ongoing high blood pressure should be treated with medicines if weight loss and exercise are not effective.  If you are 55 to 56 years old, ask your caregiver if you should take aspirin to prevent strokes.  Diabetes   screening involves taking a blood sample to check your fasting blood sugar level. This should be done once every 3 years, after age 45, if you are within normal weight and without risk factors for diabetes. Testing should be considered at a younger age or be carried out more frequently if you are overweight and have at least 1 risk factor for diabetes.  Breast cancer screening is essential preventive care for women. You should practice "breast  self-awareness." This means understanding the normal appearance and feel of your breasts and may include breast self-examination. Any changes detected, no matter how small, should be reported to a caregiver. Women in their 20s and 30s should have a clinical breast exam (CBE) by a caregiver as part of a regular health exam every 1 to 3 years. After age 40, women should have a CBE every year. Starting at age 40, women should consider having a mammography (breast X-ray test) every year. Women who have a family history of breast cancer should talk to their caregiver about genetic screening. Women at a high risk of breast cancer should talk to their caregivers about having magnetic resonance imaging (MRI) and a mammography every year.  The Pap test is a screening test for cervical cancer. A Pap test can show cell changes on the cervix that might become cervical cancer if left untreated. A Pap test is a procedure in which cells are obtained and examined from the lower end of the uterus (cervix).  Women should have a Pap test starting at age 21.  Between ages 21 and 29, Pap tests should be repeated every 2 years.  Beginning at age 30, you should have a Pap test every 3 years as long as the past 3 Pap tests have been normal.  Some women have medical problems that increase the chance of getting cervical cancer. Talk to your caregiver about these problems. It is especially important to talk to your caregiver if a new problem develops soon after your last Pap test. In these cases, your caregiver may recommend more frequent screening and Pap tests.  The above recommendations are the same for women who have or have not gotten the vaccine for human papillomavirus (HPV).  If you had a hysterectomy for a problem that was not cancer or a condition that could lead to cancer, then you no longer need Pap tests. Even if you no longer need a Pap test, a regular exam is a good idea to make sure no other problems are  starting.  If you are between ages 65 and 70, and you have had normal Pap tests going back 10 years, you no longer need Pap tests. Even if you no longer need a Pap test, a regular exam is a good idea to make sure no other problems are starting.  If you have had past treatment for cervical cancer or a condition that could lead to cancer, you need Pap tests and screening for cancer for at least 20 years after your treatment.  If Pap tests have been discontinued, risk factors (such as a new sexual partner) need to be reassessed to determine if screening should be resumed.  The HPV test is an additional test that may be used for cervical cancer screening. The HPV test looks for the virus that can cause the cell changes on the cervix. The cells collected during the Pap test can be tested for HPV. The HPV test could be used to screen women aged 30 years and older, and should   be used in women of any age who have unclear Pap test results. After the age of 30, women should have HPV testing at the same frequency as a Pap test.  Colorectal cancer can be detected and often prevented. Most routine colorectal cancer screening begins at the age of 50 and continues through age 75. However, your caregiver may recommend screening at an earlier age if you have risk factors for colon cancer. On a yearly basis, your caregiver may provide home test kits to check for hidden blood in the stool. Use of a small camera at the end of a tube, to directly examine the colon (sigmoidoscopy or colonoscopy), can detect the earliest forms of colorectal cancer. Talk to your caregiver about this at age 50, when routine screening begins. Direct examination of the colon should be repeated every 5 to 10 years through age 75, unless early forms of pre-cancerous polyps or small growths are found.  Hepatitis C blood testing is recommended for all people born from 1945 through 1965 and any individual with known risks for hepatitis C.  Practice  safe sex. Use condoms and avoid high-risk sexual practices to reduce the spread of sexually transmitted infections (STIs). STIs include gonorrhea, chlamydia, syphilis, trichomonas, herpes, HPV, and human immunodeficiency virus (HIV). Herpes, HIV, and HPV are viral illnesses that have no cure. They can result in disability, cancer, and death. Sexually active women aged 25 and younger should be checked for chlamydia. Older women with new or multiple partners should also be tested for chlamydia. Testing for other STIs is recommended if you are sexually active and at increased risk.  Osteoporosis is a disease in which the bones lose minerals and strength with aging. This can result in serious bone fractures. The risk of osteoporosis can be identified using a bone density scan. Women ages 65 and over and women at risk for fractures or osteoporosis should discuss screening with their caregivers. Ask your caregiver whether you should take a calcium supplement or vitamin D to reduce the rate of osteoporosis.  Menopause can be associated with physical symptoms and risks. Hormone replacement therapy is available to decrease symptoms and risks. You should talk to your caregiver about whether hormone replacement therapy is right for you.  Use sunscreen with sun protection factor (SPF) of 30 or more. Apply sunscreen liberally and repeatedly throughout the day. You should seek shade when your shadow is shorter than you. Protect yourself by wearing long sleeves, pants, a wide-brimmed hat, and sunglasses year round, whenever you are outdoors.  Once a month, do a whole body skin exam, using a mirror to look at the skin on your back. Notify your caregiver of new moles, moles that have irregular borders, moles that are larger than a pencil eraser, or moles that have changed in shape or color.  Stay current with required immunizations.  Influenza. You need a dose every fall (or winter). The composition of the flu vaccine  changes each year, so being vaccinated once is not enough.  Pneumococcal polysaccharide. You need 1 to 2 doses if you smoke cigarettes or if you have certain chronic medical conditions. You need 1 dose at age 65 (or older) if you have never been vaccinated.  Tetanus, diphtheria, pertussis (Tdap, Td). Get 1 dose of Tdap vaccine if you are younger than age 65, are over 65 and have contact with an infant, are a healthcare worker, are pregnant, or simply want to be protected from whooping cough. After that, you need a Td   booster dose every 10 years. Consult your caregiver if you have not had at least 3 tetanus and diphtheria-containing shots sometime in your life or have a deep or dirty wound.  HPV. You need this vaccine if you are a woman age 26 or younger. The vaccine is given in 3 doses over 6 months.  Measles, mumps, rubella (MMR). You need at least 1 dose of MMR if you were born in 1957 or later. You may also need a second dose.  Meningococcal. If you are age 19 to 21 and a first-year college student living in a residence hall, or have one of several medical conditions, you need to get vaccinated against meningococcal disease. You may also need additional booster doses.  Zoster (shingles). If you are age 60 or older, you should get this vaccine.  Varicella (chickenpox). If you have never had chickenpox or you were vaccinated but received only 1 dose, talk to your caregiver to find out if you need this vaccine.  Hepatitis A. You need this vaccine if you have a specific risk factor for hepatitis A virus infection or you simply wish to be protected from this disease. The vaccine is usually given as 2 doses, 6 to 18 months apart.  Hepatitis B. You need this vaccine if you have a specific risk factor for hepatitis B virus infection or you simply wish to be protected from this disease. The vaccine is given in 3 doses, usually over 6 months. Preventive Services / Frequency Ages 19 to 39  Blood  pressure check.** / Every 1 to 2 years.  Lipid and cholesterol check.** / Every 5 years beginning at age 20.  Clinical breast exam.** / Every 3 years for women in their 20s and 30s.  Pap test.** / Every 2 years from ages 21 through 29. Every 3 years starting at age 30 through age 65 or 70 with a history of 3 consecutive normal Pap tests.  HPV screening.** / Every 3 years from ages 30 through ages 65 to 70 with a history of 3 consecutive normal Pap tests.  Hepatitis C blood test.** / For any individual with known risks for hepatitis C.  Skin self-exam. / Monthly.  Influenza immunization.** / Every year.  Pneumococcal polysaccharide immunization.** / 1 to 2 doses if you smoke cigarettes or if you have certain chronic medical conditions.  Tetanus, diphtheria, pertussis (Tdap, Td) immunization. / A one-time dose of Tdap vaccine. After that, you need a Td booster dose every 10 years.  HPV immunization. / 3 doses over 6 months, if you are 26 and younger.  Measles, mumps, rubella (MMR) immunization. / You need at least 1 dose of MMR if you were born in 1957 or later. You may also need a second dose.  Meningococcal immunization. / 1 dose if you are age 19 to 21 and a first-year college student living in a residence hall, or have one of several medical conditions, you need to get vaccinated against meningococcal disease. You may also need additional booster doses.  Varicella immunization.** / Consult your caregiver.  Hepatitis A immunization.** / Consult your caregiver. 2 doses, 6 to 18 months apart.  Hepatitis B immunization.** / Consult your caregiver. 3 doses usually over 6 months. Ages 40 to 64  Blood pressure check.** / Every 1 to 2 years.  Lipid and cholesterol check.** / Every 5 years beginning at age 20.  Clinical breast exam.** / Every year after age 40.  Mammogram.** / Every year beginning at age 40   and continuing for as long as you are in good health. Consult with your  caregiver.  Pap test.** / Every 3 years starting at age 30 through age 65 or 70 with a history of 3 consecutive normal Pap tests.  HPV screening.** / Every 3 years from ages 30 through ages 65 to 70 with a history of 3 consecutive normal Pap tests.  Fecal occult blood test (FOBT) of stool. / Every year beginning at age 50 and continuing until age 75. You may not need to do this test if you get a colonoscopy every 10 years.  Flexible sigmoidoscopy or colonoscopy.** / Every 5 years for a flexible sigmoidoscopy or every 10 years for a colonoscopy beginning at age 50 and continuing until age 75.  Hepatitis C blood test.** / For all people born from 1945 through 1965 and any individual with known risks for hepatitis C.  Skin self-exam. / Monthly.  Influenza immunization.** / Every year.  Pneumococcal polysaccharide immunization.** / 1 to 2 doses if you smoke cigarettes or if you have certain chronic medical conditions.  Tetanus, diphtheria, pertussis (Tdap, Td) immunization.** / A one-time dose of Tdap vaccine. After that, you need a Td booster dose every 10 years.  Measles, mumps, rubella (MMR) immunization. / You need at least 1 dose of MMR if you were born in 1957 or later. You may also need a second dose.  Varicella immunization.** / Consult your caregiver.  Meningococcal immunization.** / Consult your caregiver.  Hepatitis A immunization.** / Consult your caregiver. 2 doses, 6 to 18 months apart.  Hepatitis B immunization.** / Consult your caregiver. 3 doses, usually over 6 months. Ages 65 and over  Blood pressure check.** / Every 1 to 2 years.  Lipid and cholesterol check.** / Every 5 years beginning at age 20.  Clinical breast exam.** / Every year after age 40.  Mammogram.** / Every year beginning at age 40 and continuing for as long as you are in good health. Consult with your caregiver.  Pap test.** / Every 3 years starting at age 30 through age 65 or 70 with a 3  consecutive normal Pap tests. Testing can be stopped between 65 and 70 with 3 consecutive normal Pap tests and no abnormal Pap or HPV tests in the past 10 years.  HPV screening.** / Every 3 years from ages 30 through ages 65 or 70 with a history of 3 consecutive normal Pap tests. Testing can be stopped between 65 and 70 with 3 consecutive normal Pap tests and no abnormal Pap or HPV tests in the past 10 years.  Fecal occult blood test (FOBT) of stool. / Every year beginning at age 50 and continuing until age 75. You may not need to do this test if you get a colonoscopy every 10 years.  Flexible sigmoidoscopy or colonoscopy.** / Every 5 years for a flexible sigmoidoscopy or every 10 years for a colonoscopy beginning at age 50 and continuing until age 75.  Hepatitis C blood test.** / For all people born from 1945 through 1965 and any individual with known risks for hepatitis C.  Osteoporosis screening.** / A one-time screening for women ages 65 and over and women at risk for fractures or osteoporosis.  Skin self-exam. / Monthly.  Influenza immunization.** / Every year.  Pneumococcal polysaccharide immunization.** / 1 dose at age 65 (or older) if you have never been vaccinated.  Tetanus, diphtheria, pertussis (Tdap, Td) immunization. / A one-time dose of Tdap vaccine if you are over   65 and have contact with an infant, are a healthcare worker, or simply want to be protected from whooping cough. After that, you need a Td booster dose every 10 years.  Varicella immunization.** / Consult your caregiver.  Meningococcal immunization.** / Consult your caregiver.  Hepatitis A immunization.** / Consult your caregiver. 2 doses, 6 to 18 months apart.  Hepatitis B immunization.** / Check with your caregiver. 3 doses, usually over 6 months. ** Family history and personal history of risk and conditions may change your caregiver's recommendations. Document Released: 04/06/2001 Document Revised: 05/03/2011  Document Reviewed: 07/06/2010 [employer] Patient Information 2014 [employer].  

## 2012-09-07 ENCOUNTER — Other Ambulatory Visit: Payer: Self-pay

## 2012-09-07 MED ORDER — ATORVASTATIN CALCIUM 40 MG PO TABS: 40.0000 mg | ORAL_TABLET | Freq: Every day | ORAL | Status: DC

## 2012-12-28 ENCOUNTER — Other Ambulatory Visit: Payer: Self-pay

## 2013-01-25 ENCOUNTER — Other Ambulatory Visit: Payer: Self-pay | Admitting: Family Medicine

## 2013-01-25 MED ORDER — AMLODIPINE BESYLATE 10 MG PO TABS: ORAL_TABLET | ORAL | Status: DC

## 2013-01-31 ENCOUNTER — Telehealth: Payer: Self-pay | Admitting: *Deleted

## 2013-01-31 NOTE — Telephone Encounter (Signed)
Patient called and stated that she went to pick up her prescription from the pharmacy she noticed that she was only given a 30 day supply. Informed patient that she was due for her labs in January that was the reason she was only given that amount. Patient was informed that in order to continue to receive her medications she would need to have an office visit with follow up labs. Patient got very upset and stated that she doesn't feel like she should have to see Dr. Laury Axon because nothing has changed over the years. Stated to patient that that was policy and that it was very necessary for her to have the office visit to make sure nothing has changed. Patient states "all she is going to do is sit at that computer and click and tell me everything is okay". Patient states that her insurance does not allow her to make multiple trips to the doctor's office. Patient then asked if she could just scheduled a lab visit. Advised patient that I will not be able to schedule just a lab visit, I would have to have Dr. Laury Axon permission to do so. Patient states that she doesn't have time for this s--- and proceed to hang up the phone.

## 2013-02-01 NOTE — Telephone Encounter (Signed)
You are correct and with new medicare rules some ins are separating wellness visit and physical.

## 2013-02-02 ENCOUNTER — Other Ambulatory Visit: Payer: Self-pay

## 2013-02-02 DIAGNOSIS — Z1231 Encounter for screening mammogram for malignant neoplasm of breast: Secondary | ICD-10-CM

## 2013-02-14 ENCOUNTER — Other Ambulatory Visit: Payer: Self-pay | Admitting: Family Medicine

## 2013-02-19 ENCOUNTER — Other Ambulatory Visit: Payer: Self-pay | Admitting: Family Medicine

## 2013-05-17 ENCOUNTER — Ambulatory Visit
Admission: RE | Admit: 2013-05-17 | Discharge: 2013-05-17 | Disposition: A | Discharge status: Home / Self Care | Payer: Federal, State, Local not specified - PPO | Source: Ambulatory Visit

## 2013-05-17 DIAGNOSIS — Z1231 Encounter for screening mammogram for malignant neoplasm of breast: Secondary | ICD-10-CM

## 2014-05-06 DIAGNOSIS — R3129 Other microscopic hematuria: Secondary | ICD-10-CM | POA: Insufficient documentation

## 2014-07-19 DIAGNOSIS — M41129 Adolescent idiopathic scoliosis, site unspecified: Secondary | ICD-10-CM | POA: Insufficient documentation

## 2014-07-19 DIAGNOSIS — Z8249 Family history of ischemic heart disease and other diseases of the circulatory system: Secondary | ICD-10-CM | POA: Insufficient documentation

## 2014-08-23 ENCOUNTER — Ambulatory Visit (INDEPENDENT_AMBULATORY_CARE_PROVIDER_SITE_OTHER): Payer: Federal, State, Local not specified - PPO

## 2014-08-23 ENCOUNTER — Encounter: Payer: Self-pay | Admitting: Sports Medicine

## 2014-08-23 ENCOUNTER — Ambulatory Visit (INDEPENDENT_AMBULATORY_CARE_PROVIDER_SITE_OTHER): Payer: Federal, State, Local not specified - PPO | Admitting: Sports Medicine

## 2014-08-23 VITALS — BP 110/75 | HR 63 | Ht 65.0 in | Wt 156.0 lb

## 2014-08-23 DIAGNOSIS — M545 Low back pain, unspecified: Secondary | ICD-10-CM

## 2014-08-23 DIAGNOSIS — M4186 Other forms of scoliosis, lumbar region: Secondary | ICD-10-CM

## 2014-08-23 DIAGNOSIS — M5412 Radiculopathy, cervical region: Secondary | ICD-10-CM

## 2014-08-23 DIAGNOSIS — M1712 Unilateral primary osteoarthritis, left knee: Secondary | ICD-10-CM

## 2014-08-23 DIAGNOSIS — M542 Cervicalgia: Secondary | ICD-10-CM | POA: Diagnosis not present

## 2014-08-23 DIAGNOSIS — M170 Bilateral primary osteoarthritis of knee: Secondary | ICD-10-CM

## 2014-08-23 MED ORDER — MELOXICAM 15 MG PO TABS: ORAL_TABLET | ORAL | Status: DC

## 2014-08-23 NOTE — Assessment & Plan Note (Signed)
Scoliosis is present however this is rarely a cause of pain. Usually simple lumbar spondylosis is the cause, degenerative disc disease, facet arthritis. She likely has some left-sided lumbar degenerative disc disease. Formal PT, patient declines prednisone, meloxicam will be prescribed. Return in one month, MRI for interventional injection planning if no better.

## 2014-08-23 NOTE — Progress Notes (Signed)
   Subjective:    I'm seeing this patient as a consultation for:  Dr. Loreen FreudYvonne Lowne  CC: Multiple complaints  HPI: Right shoulder pain: Burning sensation in the right periscapular region and between the shoulder blades, minimal neck pain, nothing radicular down the arms. Moderate, persistent without radiation. She is tried various changes in her again on except work without much success. She does desire a relatively minimalistic approach.  Low back pain: Left-sided, localized across the low back, worse with sitting, flexion, okay with Valsalva. No bowel or bladder dysfunction, saddle numbness, and nothing radicular down either leg. She does have a history of scoliosis.  Left knee pain: Moderate, persistent, present for years, worse at the medial joint line with swelling without mechanical symptoms. She apparently had a knee injection by another provider years ago, and is now gravely afraid of this procedure.  Past medical history, Surgical history, Family history not pertinant except as noted below, Social history, Allergies, and medications have been entered into the medical record, reviewed, and no changes needed.   Review of Systems: No headache, visual changes, nausea, vomiting, diarrhea, constipation, dizziness, abdominal pain, skin rash, fevers, chills, night sweats, weight loss, swollen lymph nodes, body aches, joint swelling, muscle aches, chest pain, shortness of breath, mood changes, visual or auditory hallucinations.   Objective:   General: Well Developed, well nourished, and in no acute distress.  Neuro/Psych: Alert and oriented x3, extra-ocular muscles intact, able to move all 4 extremities, sensation grossly intact. Skin: Warm and dry, no rashes noted.  Respiratory: Not using accessory muscles, speaking in full sentences, trachea midline.  Cardiovascular: Pulses palpable, no extremity edema. Abdomen: Does not appear distended. Neck: Negative spurling's Full neck range of  motion Grip strength and sensation normal in bilateral hands Strength good C4 to T1 distribution No sensory change to C4 to T1 Reflexes normal Back Exam:  Inspection: Unremarkable  Motion: Flexion 45 deg, Extension 45 deg, Side Bending to 45 deg bilaterally,  Rotation to 45 deg bilaterally  SLR laying: Negative  XSLR laying: Negative  Palpable tenderness: None. FABER: negative. Sensory change: Gross sensation intact to all lumbar and sacral dermatomes.  Reflexes: 2+ at both patellar tendons, 2+ at achilles tendons, Babinski's downgoing.  Strength at foot  Plantar-flexion: 5/5 Dorsi-flexion: 5/5 Eversion: 5/5 Inversion: 5/5  Leg strength  Quad: 5/5 Hamstring: 5/5 Hip flexor: 5/5 Hip abductors: 5/5  Gait unremarkable. Left Knee: Minimal swelling with tenderness at the medial joint line. No effusion. ROM normal in flexion and extension and lower leg rotation. Ligaments with solid consistent endpoints including ACL, PCL, LCL, MCL. Negative Mcmurray's and provocative meniscal tests. Non painful patellar compression. Patellar and quadriceps tendons unremarkable. Hamstring and quadriceps strength is normal.  X-rays personally reviewed, there is L2-L3 and L3-L4 lumbar degenerative disc disease that is mild, there is multilevel mid cervical degenerative disc disease that is fairly severe, and bilateral, left worse than right tricompartment osteoarthritis.  Impression and Recommendations:   This case required medical decision making of moderate complexity.

## 2014-08-23 NOTE — Assessment & Plan Note (Signed)
X-rays, meloxicam. Formal PT.

## 2014-08-23 NOTE — Assessment & Plan Note (Signed)
X-rays, formal physical therapy, meloxicam. Periscapular burning symptoms are typically related to C5 and C6 radiculopathy. Return in one month, MRI for interventional if no better.

## 2014-09-03 ENCOUNTER — Telehealth: Payer: Self-pay | Admitting: Sports Medicine

## 2014-09-03 NOTE — Telephone Encounter (Signed)
Patient called to get her imaging results. This information was given, no further questions.  Pt states she needs a letter from Dr. Benjamin Stainhekkekandam stating she needs an "ergonomic chair" and her employer will get her one. Will route.

## 2014-09-05 ENCOUNTER — Encounter: Payer: Self-pay | Admitting: Sports Medicine

## 2014-09-05 NOTE — Telephone Encounter (Signed)
Letter in box. 

## 2014-09-12 ENCOUNTER — Ambulatory Visit (INDEPENDENT_AMBULATORY_CARE_PROVIDER_SITE_OTHER): Payer: Federal, State, Local not specified - PPO | Admitting: Physical Therapy

## 2014-09-12 ENCOUNTER — Encounter: Payer: Self-pay | Admitting: Physical Therapy

## 2014-09-12 ENCOUNTER — Other Ambulatory Visit: Payer: Self-pay

## 2014-09-12 DIAGNOSIS — R29898 Other symptoms and signs involving the musculoskeletal system: Secondary | ICD-10-CM

## 2014-09-12 DIAGNOSIS — R293 Abnormal posture: Secondary | ICD-10-CM

## 2014-09-12 DIAGNOSIS — R52 Pain, unspecified: Secondary | ICD-10-CM

## 2014-09-12 DIAGNOSIS — Z1231 Encounter for screening mammogram for malignant neoplasm of breast: Secondary | ICD-10-CM

## 2014-09-12 DIAGNOSIS — M6281 Muscle weakness (generalized): Secondary | ICD-10-CM

## 2014-09-12 NOTE — Therapy (Addendum)
[facility] [place], [place], [address] Phone: [phone]   Fax:  [phone]  Physical Therapy Evaluation  Patient Details  Name: XUAN MATEUS MRN: [id number] Date of Birth: Nov 14, 1955 Referring Provider:  Silverio Decamp,*  Encounter Date: 09/12/2014      PT End of Session - 09/12/14 1550    Visit Number 1   Number of Visits 8   Date for PT Re-Evaluation 10/10/14   PT Start Time 4401   PT Stop Time 1704   PT Time Calculation (min) 74 min      Past Medical History  Diagnosis Date  . Hyperlipidemia   . Hypertension     Past Surgical History  Procedure Laterality Date  . Breast surgery  1992    lumpectomy--no CA    There were no vitals filed for this visit.  Visit Diagnosis:  Weakness of back - Plan: PT plan of care cert/re-cert  Pain of multiple sites - Plan: PT plan of care cert/re-cert  Abnormal posture - Plan: PT plan of care cert/re-cert      Subjective Assessment - 09/12/14 1555    Subjective Pt reports she is most concerned with her neck, over the last month using a computer she looks through the bottom of her glasses to see the screen.  She has her computer adjuctsed as lowa as possible   Pertinent History goes to the gym, uses treadmill, walks and jogs. chest press, rows, lat pull down    Diagnostic tests x-rays show degeneratvie changes in the spine, and knee   Patient Stated Goals get rid of nagging numbness into the Lt hand, and improve low back. Play golf and weed eating   Currently in Pain? Yes   Pain Location Neck   Pain Orientation Left   Pain Descriptors / Indicators Tightness   Pain Radiating Towards into Lt ankle first three fingers. - only with tilting head up and holding within 5 sec.    Pain Frequency Intermittent   Aggravating Factors  tilting head up   Pain Relieving Factors straightening head   Effect of Pain on Daily Activities low back no pain, however  with standing still for prolonged periods she will have pain Lt side, along with bending forward,  no pain with moving around and other positions.             [facility] PT Assessment - 09/12/14 0001    Assessment   Medical Diagnosis Rt cervical radiculopathy, lumbar spondylosis, Lt knee OA   Onset Date/Surgical Date 08/13/14   Hand Dominance Right   Prior Therapy none   Precautions   Precautions None   Balance Screen   Has the patient fallen in the past 6 months Yes   How many times? 2  both times tripped on her feet   Has the patient had a decrease in activity level because of a fear of falling?  No   Is the patient reluctant to leave their home because of a fear of falling?  No   Prior Function   Level of Independence Independent   Vocation Full time employment   Engineer, manufacturing job, computer   Leisure exercise   Observation/Other Assessments   Focus on Therapeutic Outcomes (FOTO)  23% limited in her neck   Posture/Postural Control   Posture/Postural Control Postural limitations   Postural Limitations Forward head  mild upper body shift to Rt   ROM / Strength   AROM / PROM /  Strength AROM;Strength   AROM   Overall AROM  Within functional limits for tasks performed   AROM Assessment Site --  symptoms return in Lt UE with cervical ext   Strength   Overall Strength Comments Rt UE WNL, Lt grossly 5-/5  upper back grossly 4+/5   Strength Assessment Site Hip;Hand  Rt LE WNL   Right/Left hand Right;Left   Right Hand Grip (lbs) 63#   Left Hand Grip (lbs) 60#   Right/Left Hip Left   Left Hip Flexion 4+/5   Left Hip Extension 4+/5  Rt hip extension 4+/5   Palpation   Spinal mobility good mobility of spine with CPA's and UPA's along the whole spine.    Palpation comment tightness paraspinals T4-T9 Lt                           PT Education - 09/12/14 1719    Education provided Yes   Education Details HEP and work station set up   [employer])  Educated Patient   Methods Explanation;Demonstration;Handout   Comprehension Returned demonstration;Verbalized understanding             PT Long Term Goals - 09/12/14 1731    PT LONG TERM GOAL #1   Title I with advanced HEP for core (10/10/14)   Time 4   Period Weeks   Status New   PT LONG TERM GOAL #2   Title Increase strength upper back =/> 5-/5  (10/10/14)   Time 4   Period Weeks   Status New   PT LONG TERM GOAL #3   Title reports =/> 75% reduction in Lt hand symptoms while at work (10/10/14)   Time 4   Period Weeks   Status New   PT LONG TERM GOAL #4   Title improve FOTO =/>  21% ( 10/10/14)    Time 4   Period Weeks   Status New   PT LONG TERM GOAL #5   Title increase bilat hip extension =/> 5-/5 ( 10/10/14)    Time 4   Period Weeks   Status New               Plan - 09/12/14 1736    Clinical Impression Statement 59 yo female presents with ~ 1 month h/o neck pain, she also has intermittent low back pain.  Her order is for Lt knee also however she doesn't feel this needs to be addressed at this time.  Patient is very concerned  due to numbness in her Lt three fingers.    Pt will benefit from skilled therapeutic intervention in order to improve on the following deficits Postural dysfunction;Decreased strength;Improper body mechanics;Pain   Rehab Potential Good   PT Frequency 2x / week   PT Duration 4 weeks   PT Treatment/Interventions Passive range of motion;Patient/family education;Electrical Stimulation;Cryotherapy;Manual techniques;Therapeutic exercise;Ultrasound;Traction;Moist Heat   PT Next Visit Plan progress whole back strengthening, modalities PRN.    Consulted and Agree with Plan of Care Patient         Problem List Patient Active Problem List   Diagnosis Date Noted  . Right cervical radiculopathy 08/23/2014  . Left low back pain 08/23/2014  . Primary osteoarthritis of left knee 08/23/2014  . TINNITUS, CHRONIC, LEFT 02/11/2009  .  HYPERLIPIDEMIA 08/06/2008  . HYPERTENSION 08/06/2008    Jeral Pinch, PT 09/12/2014, 5:42 PM  [facility] [place], [place], [address] Phone: [phone]  Fax:  [phone]    PHYSICAL THERAPY DISCHARGE SUMMARY Visits from Start of Care: 1  Current functional level related to goals / functional outcomes: unknown   Remaining deficits: unknown   Education / Equipment: Initial HEP Plan:                                                    Patient goals were not met. Patient is being discharged due to not returning since the last visit.  ?????   Jeral Pinch, PT 10/15/2014 12:31 PM

## 2014-09-12 NOTE — Patient Instructions (Addendum)
Pelvic Press      [place] [phone]   Place hands under belly between navel and pubic bone, palms up. Feel pressure on hands. Increase pressure on hands by pressing pelvis down. This is NOT a pelvic tilt. Hold _5__ seconds. Relax. Repeat _10__ times.  Copyright  VHI. All rights reserved.  Head Lift   Forehead resting on hands, palms down. Tuck chin SLIGHTLY toward chest. Hold chin stable, lift forehead slightly off hands. Do not: push on arms, raise shoulders, or arch the neck. Hold __1_ seconds. Relax. Repeat 10___ times  Shoulder Blade Squeeze: Airplane   Arms out to sides at 90, elbows straight, palms down. Press pelvis down. Squeeze backbone with shoulder blades. Raise arms, front of shoulders, chest, and head. Keep neck neutral. Hold _1__ seconds. Relax. Repeat _10__ times. Once a day  Self-Mobilization: Knee Flexion (Prone)   Perform pelvic press first, then bring left heel toward buttocks as close as possible. Hold _1___ seconds. Relax. Repeat on right side Repeat __10__ times per set. Do __1__ sets per session. Do __1__ sessions per day.  THEN perform pelvic press and bend both heels up together and straighten keeping the press   Lumbar Rotation (Non-Weight Bearing)   Feet on floor, slowly rock knees from side to side in small, pain-free range of motion. Allow lower back to rotate slightly. Repeat __10__ times per set. Do ___1_ sets per session. Do _1___ sessions per day.  Copyright  VHI. All rights reserved.  Hand out on work station set up.

## 2014-09-18 ENCOUNTER — Encounter: Payer: Federal, State, Local not specified - PPO | Admitting: Physical Therapy

## 2014-09-20 ENCOUNTER — Ambulatory Visit: Payer: Federal, State, Local not specified - PPO | Admitting: Sports Medicine

## 2014-10-15 LAB — HM PAP SMEAR: HM Pap smear: NEGATIVE

## 2014-10-21 ENCOUNTER — Ambulatory Visit
Admission: RE | Admit: 2014-10-21 | Discharge: 2014-10-21 | Disposition: A | Discharge status: Home / Self Care | Payer: Federal, State, Local not specified - PPO | Source: Ambulatory Visit

## 2014-10-21 DIAGNOSIS — Z1231 Encounter for screening mammogram for malignant neoplasm of breast: Secondary | ICD-10-CM

## 2014-10-31 ENCOUNTER — Ambulatory Visit (INDEPENDENT_AMBULATORY_CARE_PROVIDER_SITE_OTHER): Payer: Federal, State, Local not specified - PPO | Admitting: Family Medicine

## 2014-10-31 ENCOUNTER — Encounter: Payer: Self-pay | Admitting: Family Medicine

## 2014-10-31 VITALS — BP 122/82 | HR 82 | Ht 65.0 in | Wt 161.0 lb

## 2014-10-31 DIAGNOSIS — E785 Hyperlipidemia, unspecified: Secondary | ICD-10-CM

## 2014-10-31 DIAGNOSIS — Z23 Encounter for immunization: Secondary | ICD-10-CM | POA: Diagnosis not present

## 2014-10-31 DIAGNOSIS — R312 Other microscopic hematuria: Secondary | ICD-10-CM | POA: Diagnosis not present

## 2014-10-31 DIAGNOSIS — R3129 Other microscopic hematuria: Secondary | ICD-10-CM

## 2014-10-31 DIAGNOSIS — I10 Essential (primary) hypertension: Secondary | ICD-10-CM

## 2014-10-31 NOTE — Patient Instructions (Signed)
Thank you for coming in today. You are doing well and keep up the good work .  Come back for a dedicated visit for FMLA paper work.  Return every 6 months for BP recheck.   Call or go to the emergency room if you get worse, have trouble breathing, have chest pains, or palpitations.

## 2014-10-31 NOTE — Progress Notes (Signed)
Kristine Mclaughlin is a 59 y.o. female who presents to [facility]  today for establish care. Patient has hypertension and microscopic hemoglobinuria. Hypertension is well controlled with the below medications. Additionally her cholesterol is well controlled with Crestor 20. She denies any chest pains or palpitations. Her microscopic hemoglobinuria is currently being evaluated by urology with a normal cystoscope and CT scan.  She currently is asymptomatic.   Past Medical History  Diagnosis Date  . Hyperlipidemia   . Hypertension    Past Surgical History  Procedure Laterality Date  . Breast surgery  1992    lumpectomy--no CA   Social History  Substance Use Topics  . Smoking status: Never Smoker   . Smokeless tobacco: Not on file  . Alcohol Use: 0.6 oz/week    1 Cans of beer per week   family history includes Coronary artery disease in an other family member; Diabetes in her brother and father; Heart disease (age of onset: 43) in her brother; Heart disease (age of onset: 53) in her father; Hyperlipidemia in her brother and father; Hypertension in her brother and father.  ROS as above Medications: Current Outpatient Prescriptions  Medication Sig Dispense Refill  . amLODipine (NORVASC) 10 MG tablet TAKE 1 TABLET (10 MG TOTAL) BY MOUTH DAILY. 90 tablet 0  . aspirin EC 81 MG tablet Take 81 mg by mouth.    Marland Kitchen atorvastatin (LIPITOR) 40 MG tablet TAKE 1 TABLET EVERY DAY 30 tablet 1  . benazepril (LOTENSIN) 20 MG tablet TAKE 1 TABLET (20 MG TOTAL) BY MOUTH DAILY. 90 tablet 1  . Calcium Carbonate-Vitamin D (CALCIUM-VITAMIN D) 500-200 MG-UNIT per tablet Take 1 tablet by mouth 3 (three) times daily with meals.      . meloxicam (MOBIC) 15 MG tablet One tab PO qAM with breakfast for 2 weeks, then daily prn pain. 30 tablet 3  . Multiple Vitamin (MULTIVITAMIN) tablet Take 1 tablet by mouth daily.    . rosuvastatin (CRESTOR) 20 MG tablet Take 20 mg by mouth.     No  current facility-administered medications for this visit.   No Known Allergies   Exam:  BP 122/82 mmHg  Pulse 82  Ht  (1.651 m)  Wt 161 lb (73.029 kg)  BMI 26.79 kg/m2 Gen: Well NAD HEENT: EOMI,  MMM Lungs: Normal work of breathing. CTABL Heart: RRR no MRG Abd: NABS, Soft. Nondistended, Nontender Exts: Brisk capillary refill, warm and well perfused.   Metabolic panel, lipid panel, and CT scan reviewed from [facility] and are normal.l  No results found for this or any previous visit (from the past 24 hour(s)). No results found.   Tdap vaccine given prior to discharge.  Patient declined flu vaccine  Please see individual assessment and plan sections.

## 2014-10-31 NOTE — Assessment & Plan Note (Signed)
Continue Crestor. Recheck lipids in 6 months

## 2014-10-31 NOTE — Assessment & Plan Note (Signed)
Currently well controlled. Continue current medications. Check metabolic panel in 6-12 months.

## 2014-10-31 NOTE — Assessment & Plan Note (Signed)
Continue to follow per urology

## 2014-11-12 ENCOUNTER — Ambulatory Visit (INDEPENDENT_AMBULATORY_CARE_PROVIDER_SITE_OTHER): Payer: Federal, State, Local not specified - PPO | Admitting: Family Medicine

## 2014-11-12 ENCOUNTER — Encounter: Payer: Self-pay | Admitting: Family Medicine

## 2014-11-12 VITALS — BP 128/83 | HR 62 | Ht 65.0 in | Wt 160.0 lb

## 2014-11-12 DIAGNOSIS — E785 Hyperlipidemia, unspecified: Secondary | ICD-10-CM | POA: Diagnosis not present

## 2014-11-12 DIAGNOSIS — M545 Low back pain, unspecified: Secondary | ICD-10-CM

## 2014-11-12 MED ORDER — ROSUVASTATIN CALCIUM 20 MG PO TABS: 20.0000 mg | ORAL_TABLET | Freq: Every day | ORAL | Status: DC

## 2014-11-12 NOTE — Patient Instructions (Signed)
Thank you for coming in today. Please return in 6 months to recheck cholesterol.  Call or go to the emergency room if you get worse, have trouble breathing, have chest pains, or palpitations.   Let me know if I need to modify FMLA. Be very specific what it need to say.

## 2014-11-12 NOTE — Progress Notes (Signed)
Kristine Mclaughlin is a 59 y.o. female who presents to [facility]  today for back pain. Patient is here to address her back pain and FMLA paperwork. She notes that normally she has no back pain however she develops episodes of pain flares at work because she has prolonged hours more than 40 years per week. She would like FMLA paperwork to allow her to miss days of work because of her pain episodes.   Past Medical History  Diagnosis Date  . Hyperlipidemia   . Hypertension    Past Surgical History  Procedure Laterality Date  . Breast surgery  1992    lumpectomy--no CA   Social History  Substance Use Topics  . Smoking status: Never Smoker   . Smokeless tobacco: Not on file  . Alcohol Use: 0.6 oz/week    1 Cans of beer per week   family history includes Coronary artery disease in an other family member; Diabetes in her brother and father; Heart disease (age of onset: 42) in her brother; Heart disease (age of onset: 86) in her father; Hyperlipidemia in her brother and father; Hypertension in her brother and father.  ROS as above Medications: Current Outpatient Prescriptions  Medication Sig Dispense Refill  . amLODipine (NORVASC) 10 MG tablet TAKE 1 TABLET (10 MG TOTAL) BY MOUTH DAILY. 90 tablet 0  . aspirin EC 81 MG tablet Take 81 mg by mouth.    . benazepril (LOTENSIN) 20 MG tablet TAKE 1 TABLET (20 MG TOTAL) BY MOUTH DAILY. 90 tablet 1  . Calcium Carbonate-Vitamin D (CALCIUM-VITAMIN D) 500-200 MG-UNIT per tablet Take 1 tablet by mouth 3 (three) times daily with meals.      . meloxicam (MOBIC) 15 MG tablet One tab PO qAM with breakfast for 2 weeks, then daily prn pain. 30 tablet 3  . Multiple Vitamin (MULTIVITAMIN) tablet Take 1 tablet by mouth daily.    . rosuvastatin (CRESTOR) 20 MG tablet Take 1 tablet (20 mg total) by mouth daily. 90 tablet 2   No current facility-administered medications for this visit.   No Known Allergies   Exam:  BP 128/83  mmHg  Pulse 62  Ht  (1.651 m)  Wt 160 lb (72.576 kg)  BMI 26.63 kg/m2 Gen: Well NAD HEENT: EOMI,  MMM Lungs: Normal work of breathing. CTABL Heart: RRR no MRG Abd: NABS, Soft. Nondistended, Nontender Exts: Brisk capillary refill, warm and well perfused.  Back: Nontender to midline normal back range of motion. Normal gait  No results found for this or any previous visit (from the past 24 hour(s)). No results found.   Please see individual assessment and plan sections.

## 2014-11-12 NOTE — Assessment & Plan Note (Addendum)
Doing well. FMLA paperwork filled out today

## 2014-11-14 ENCOUNTER — Encounter: Payer: Self-pay | Admitting: Sports Medicine

## 2014-12-16 ENCOUNTER — Encounter: Payer: Self-pay | Admitting: Family Medicine

## 2014-12-17 NOTE — Telephone Encounter (Signed)
Dr. Denyse Amassorey, these medications were previously rx'd by a historical provider. Thanks.

## 2014-12-23 MED ORDER — BENAZEPRIL HCL 20 MG PO TABS: 20.0000 mg | ORAL_TABLET | Freq: Every day | ORAL | Status: DC

## 2014-12-23 MED ORDER — AMLODIPINE BESYLATE 10 MG PO TABS: 10.0000 mg | ORAL_TABLET | Freq: Every day | ORAL | Status: DC

## 2014-12-23 NOTE — Telephone Encounter (Signed)
Medications ordered. You should be able to pick them up today or tomorrow.

## 2015-02-21 ENCOUNTER — Telehealth: Payer: Self-pay | Admitting: Family Medicine

## 2015-02-21 NOTE — Telephone Encounter (Signed)
Updated.      KP 

## 2015-02-21 NOTE — Telephone Encounter (Signed)
Pt says that she had her flu vac at the [facility] in October.

## 2015-04-08 ENCOUNTER — Encounter: Payer: Self-pay | Admitting: Family Medicine

## 2015-04-08 ENCOUNTER — Ambulatory Visit (INDEPENDENT_AMBULATORY_CARE_PROVIDER_SITE_OTHER): Payer: Federal, State, Local not specified - PPO | Admitting: Family Medicine

## 2015-04-08 VITALS — BP 126/77 | HR 66 | Wt 158.0 lb

## 2015-04-08 DIAGNOSIS — E785 Hyperlipidemia, unspecified: Secondary | ICD-10-CM | POA: Diagnosis not present

## 2015-04-08 DIAGNOSIS — I10 Essential (primary) hypertension: Secondary | ICD-10-CM

## 2015-04-08 DIAGNOSIS — Z114 Encounter for screening for human immunodeficiency virus [HIV]: Secondary | ICD-10-CM | POA: Diagnosis not present

## 2015-04-08 DIAGNOSIS — Z1159 Encounter for screening for other viral diseases: Secondary | ICD-10-CM

## 2015-04-08 MED ORDER — ROSUVASTATIN CALCIUM 20 MG PO TABS: 20.0000 mg | ORAL_TABLET | Freq: Every day | ORAL | Status: DC

## 2015-04-08 NOTE — Patient Instructions (Signed)
Thank you for coming in today. You were seen for follow up of your high blood pressure and high cholesterol. Your blood pressure is great today! Keep taking your medications. We will change your Crestor to the generic prescription, rosuvastatin.  Follow up as needed or in one year.

## 2015-04-08 NOTE — Progress Notes (Signed)
       Kristine Mclaughlin is a 60 y.o. female who presents to [facility]: [facility] today for BP and HLD follow up.  Patient has done quite well since last visit. Taking medications regularly and tolerating them well. Patient received a letter that Crestor was not covered, though rosuvastatin and atorvastatin are covered. Patient failed to respond adequately to simvastatin. Patient is due for yearly lipid and liver labs. Patient denies chest pain, dyspnea and palpitations.    Past Medical History  Diagnosis Date  . Hyperlipidemia   . Hypertension    Past Surgical History  Procedure Laterality Date  . Breast surgery  1992    lumpectomy--no CA   Social History  Substance Use Topics  . Smoking status: Never Smoker   . Smokeless tobacco: Not on file  . Alcohol Use: 0.6 oz/week    1 Cans of beer per week   family history includes Diabetes in her brother and father; Heart disease (age of onset: 17) in her brother; Heart disease (age of onset: 44) in her father; Hyperlipidemia in her brother and father; Hypertension in her brother and father.  ROS as above Medications: Current Outpatient Prescriptions  Medication Sig Dispense Refill  . amLODipine (NORVASC) 10 MG tablet Take 1 tablet (10 mg total) by mouth daily. 90 tablet 0  . aspirin EC 81 MG tablet Take 81 mg by mouth.    . benazepril (LOTENSIN) 20 MG tablet Take 1 tablet (20 mg total) by mouth daily. 90 tablet 0  . Calcium Carbonate-Vitamin D (CALCIUM-VITAMIN D) 500-200 MG-UNIT per tablet Take 1 tablet by mouth 3 (three) times daily with meals.      . meloxicam (MOBIC) 15 MG tablet One tab PO qAM with breakfast for 2 weeks, then daily prn pain. 30 tablet 3  . Multiple Vitamin (MULTIVITAMIN) tablet Take 1 tablet by mouth daily.    . rosuvastatin (CRESTOR) 20 MG tablet Take 1 tablet (20 mg total) by mouth daily. Use generic 90 tablet 2   No current  facility-administered medications for this visit.   No Known Allergies   Exam:  BP 126/77 mmHg  Pulse 66  Wt 158 lb (71.668 kg) Gen: Well appearing lady in NAD HEENT: EOMI,  MMM Lungs: Normal work of breathing. CTABL Heart: RRR no MRG Abd: NABS, Soft. Nondistended, Nontender Exts: Brisk capillary refill, warm and well perfused. No edema.   No results found for this or any previous visit (from the past 24 hour(s)). No results found.   Please see individual assessment and plan sections.

## 2015-04-08 NOTE — Assessment & Plan Note (Signed)
Chronic, well controlled on current regimen. Will check CMP today.

## 2015-04-08 NOTE — Assessment & Plan Note (Signed)
Chronic. Will check lipid panel. Will change Crestor to generic.

## 2015-04-09 LAB — HEPATITIS C ANTIBODY: HCV Ab: NEGATIVE

## 2015-04-09 LAB — COMPREHENSIVE METABOLIC PANEL
ALBUMIN: 4.1 g/dL (ref 3.6–5.1)
ALK PHOS: 52 U/L (ref 33–130)
ALT: 18 U/L (ref 6–29)
AST: 20 U/L (ref 10–35)
BILIRUBIN TOTAL: 0.7 mg/dL (ref 0.2–1.2)
BUN: 11 mg/dL (ref 7–25)
CO2: 27 mmol/L (ref 20–31)
CREATININE: 0.8 mg/dL (ref 0.50–1.05)
Calcium: 9.6 mg/dL (ref 8.6–10.4)
Chloride: 103 mmol/L (ref 98–110)
GLUCOSE: 92 mg/dL (ref 65–99)
Potassium: 4.3 mmol/L (ref 3.5–5.3)
SODIUM: 139 mmol/L (ref 135–146)
Total Protein: 6.6 g/dL (ref 6.1–8.1)

## 2015-04-09 LAB — LIPID PANEL
Cholesterol: 239 mg/dL — ABNORMAL HIGH (ref 125–200)
HDL: 74 mg/dL (ref >=46)
LDL CALC: 150 mg/dL — AB (ref <130)
Total CHOL/HDL Ratio: 3.2 Ratio (ref <=5.0)
Triglycerides: 77 mg/dL (ref <150)
VLDL: 15 mg/dL (ref <30)

## 2015-04-09 LAB — VITAMIN D 25 HYDROXY (VIT D DEFICIENCY, FRACTURES): VIT D 25 HYDROXY: 38 ng/mL (ref 30–100)

## 2015-04-09 LAB — HIV ANTIBODY (ROUTINE TESTING W REFLEX): HIV: NONREACTIVE

## 2015-04-09 MED ORDER — ROSUVASTATIN CALCIUM 40 MG PO TABS: 40.0000 mg | ORAL_TABLET | Freq: Every day | ORAL | Status: DC

## 2015-04-09 NOTE — Addendum Note (Signed)
Addended by: Rodolph Bong on: 04/09/2015 07:56 AM   Modules accepted: Orders

## 2015-04-09 NOTE — Progress Notes (Signed)
Quick Note:  Cholesterol is not as controlled as we would like. We will increase the dose of to the statin to 40 mg daily. Recheck in 3 months. I will prescribe a new dose. ______

## 2015-04-28 ENCOUNTER — Ambulatory Visit: Payer: Federal, State, Local not specified - PPO | Admitting: Family Medicine

## 2015-05-06 ENCOUNTER — Ambulatory Visit: Payer: Federal, State, Local not specified - PPO | Admitting: Family Medicine

## 2015-05-26 ENCOUNTER — Encounter: Payer: Self-pay | Admitting: Family Medicine

## 2015-06-16 ENCOUNTER — Other Ambulatory Visit: Payer: Self-pay | Admitting: Family Medicine

## 2015-06-18 NOTE — Telephone Encounter (Signed)
Called pt to get clarity on if and how she is taking these medications. Left vm requesting a call back.

## 2015-06-18 NOTE — Telephone Encounter (Signed)
Pt returned called stating that she is still taking the medications as prescribed.

## 2015-07-08 ENCOUNTER — Encounter: Payer: Self-pay | Admitting: Family Medicine

## 2015-07-08 ENCOUNTER — Ambulatory Visit (INDEPENDENT_AMBULATORY_CARE_PROVIDER_SITE_OTHER): Payer: Federal, State, Local not specified - PPO | Admitting: Family Medicine

## 2015-07-08 VITALS — BP 120/82 | HR 76 | Wt 154.0 lb

## 2015-07-08 DIAGNOSIS — K5900 Constipation, unspecified: Secondary | ICD-10-CM | POA: Diagnosis not present

## 2015-07-08 DIAGNOSIS — I10 Essential (primary) hypertension: Secondary | ICD-10-CM | POA: Diagnosis not present

## 2015-07-08 DIAGNOSIS — E785 Hyperlipidemia, unspecified: Secondary | ICD-10-CM

## 2015-07-08 NOTE — Assessment & Plan Note (Signed)
Well-controlled no changes. Check CMP soon.

## 2015-07-08 NOTE — Assessment & Plan Note (Signed)
Continue over the counter medications

## 2015-07-08 NOTE — Patient Instructions (Signed)
Thank you for coming in today. Get labs in the next few weeks.

## 2015-07-08 NOTE — Progress Notes (Signed)
       Kristine Mclaughlin is a 60 y.o. female who [name] today for follow-up hyperlipidemia. Patient was seen several months ago for hyperlipidemia and started on Crestor. She notes she had trouble tolerating 40 mg daily with experience of hives. She's realized that she can take 40 mg every other day and not have any significant side effects. She does note that she is dealing with constipation. She uses over-the-counter medicines which seems to help a lot. She currently is attempting to lose weight by eating a lower carbohydrate diet and feels healthy area she's quite satisfied with how things are going.   Past Medical History  Diagnosis Date  . Hyperlipidemia   . Hypertension    Past Surgical History  Procedure Laterality Date  . Breast surgery  1992    lumpectomy--no CA   Social History  Substance Use Topics  . Smoking status: Never Smoker   . Smokeless tobacco: Not on file  . Alcohol Use: 0.6 oz/week    1 Cans of beer per week   family history includes Diabetes in her brother and father; Heart disease (age of onset: 4056) in her brother; Heart disease (age of onset: 4962) in her father; Hyperlipidemia in her brother and father; Hypertension in her brother and father.  ROS as above Medications: Current Outpatient Prescriptions  Medication Sig Dispense Refill  . amLODipine (NORVASC) 10 MG tablet TAKE 1 TABLET ONCE DAILY 90 tablet 1  . aspirin EC 81 MG tablet Take 81 mg by mouth.    . benazepril (LOTENSIN) 20 MG tablet TAKE 1 TABLET ONCE DAILY 90 tablet 1  . Calcium Carbonate-Vitamin D (CALCIUM-VITAMIN D) 500-200 MG-UNIT per tablet Take 1 tablet by mouth 3 (three) times daily with meals.      . meloxicam (MOBIC) 15 MG tablet One tab PO qAM with breakfast for 2 weeks, then daily prn pain. 30 tablet 3  . Multiple Vitamin (MULTIVITAMIN) tablet Take 1 tablet by mouth daily.    .  rosuvastatin (CRESTOR) 40 MG tablet Take 1 tablet (40 mg total) by mouth daily. Use generic 90 tablet 2   No current facility-administered medications for this visit.   No Known Allergies   Exam:  BP 120/82 mmHg  Pulse 76  Wt 154 lb (69.854 kg) Gen: Well NAD HEENT: EOMI,  MMM Lungs: Normal work of breathing. CTABL Heart: RRR no MRG Abd: NABS, Soft. Nondistended, Nontender Exts: Brisk capillary refill, warm and well perfused.   No results found for this or any previous visit (from the past 24 hour(s)). No results found.   Please see individual assessment and plan sections.

## 2015-07-08 NOTE — Assessment & Plan Note (Signed)
Doing reasonably well. Check CBC CMP and lipid panel.

## 2015-08-28 DIAGNOSIS — K080 Exfoliation of teeth due to systemic causes: Secondary | ICD-10-CM | POA: Diagnosis not present

## 2015-09-02 DIAGNOSIS — E785 Hyperlipidemia, unspecified: Secondary | ICD-10-CM | POA: Diagnosis not present

## 2015-09-03 LAB — COMPREHENSIVE METABOLIC PANEL
ALBUMIN: 4.3 g/dL (ref 3.6–5.1)
ALK PHOS: 58 U/L (ref 33–130)
ALT: 19 U/L (ref 6–29)
AST: 21 U/L (ref 10–35)
BILIRUBIN TOTAL: 0.6 mg/dL (ref 0.2–1.2)
BUN: 9 mg/dL (ref 7–25)
CHLORIDE: 99 mmol/L (ref 98–110)
CO2: 26 mmol/L (ref 20–31)
CREATININE: 0.91 mg/dL (ref 0.50–1.05)
Calcium: 9.2 mg/dL (ref 8.6–10.4)
Glucose, Bld: 87 mg/dL (ref 65–99)
Potassium: 4.1 mmol/L (ref 3.5–5.3)
Sodium: 135 mmol/L (ref 135–146)
Total Protein: 6.6 g/dL (ref 6.1–8.1)

## 2015-09-03 LAB — CBC
HEMATOCRIT: 39.8 % (ref 35.0–45.0)
HEMOGLOBIN: 12.5 g/dL (ref 11.7–15.5)
MCH: 27.4 pg (ref 27.0–33.0)
MCHC: 31.4 g/dL — ABNORMAL LOW (ref 32.0–36.0)
MCV: 87.3 fL (ref 80.0–100.0)
MPV: 11 fL (ref 7.5–12.5)
Platelets: 297 10*3/uL (ref 140–400)
RBC: 4.56 MIL/uL (ref 3.80–5.10)
RDW: 14.4 % (ref 11.0–15.0)
WBC: 5.8 10*3/uL (ref 3.8–10.8)

## 2015-09-03 LAB — LIPID PANEL
CHOLESTEROL: 230 mg/dL — AB (ref 125–200)
HDL: 84 mg/dL (ref >=46)
LDL Cholesterol: 125 mg/dL (ref <130)
TRIGLYCERIDES: 105 mg/dL (ref <150)
Total CHOL/HDL Ratio: 2.7 Ratio (ref <=5.0)
VLDL: 21 mg/dL (ref <30)

## 2015-09-03 NOTE — Progress Notes (Signed)
Quick Note:  Labs look great. Continue your medicines. ______

## 2015-09-30 ENCOUNTER — Ambulatory Visit: Payer: Federal, State, Local not specified - PPO | Admitting: Family Medicine

## 2015-11-20 ENCOUNTER — Encounter: Payer: Self-pay | Admitting: Family Medicine

## 2015-11-28 ENCOUNTER — Telehealth: Payer: Self-pay | Admitting: Family Medicine

## 2015-11-28 ENCOUNTER — Encounter: Payer: Self-pay | Admitting: Family Medicine

## 2015-11-28 DIAGNOSIS — E785 Hyperlipidemia, unspecified: Secondary | ICD-10-CM

## 2015-11-28 DIAGNOSIS — H35021 Exudative retinopathy, right eye: Secondary | ICD-10-CM | POA: Insufficient documentation

## 2015-11-28 DIAGNOSIS — I10 Essential (primary) hypertension: Secondary | ICD-10-CM

## 2015-11-28 NOTE — Telephone Encounter (Signed)
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   Recommend for carotid Doppler. Order placed. Patient will be notified.

## 2015-11-28 NOTE — Telephone Encounter (Signed)
Information discussed with pt. Pt verbalized understanding. 

## 2015-12-06 ENCOUNTER — Ambulatory Visit (HOSPITAL_BASED_OUTPATIENT_CLINIC_OR_DEPARTMENT_OTHER)
Admission: RE | Admit: 2015-12-06 | Discharge: 2015-12-06 | Disposition: A | Discharge status: Home / Self Care | Payer: Federal, State, Local not specified - PPO | Source: Ambulatory Visit | Attending: Family Medicine | Admitting: Family Medicine

## 2015-12-06 DIAGNOSIS — E041 Nontoxic single thyroid nodule: Secondary | ICD-10-CM | POA: Diagnosis not present

## 2015-12-06 DIAGNOSIS — E785 Hyperlipidemia, unspecified: Secondary | ICD-10-CM | POA: Diagnosis not present

## 2015-12-06 DIAGNOSIS — I10 Essential (primary) hypertension: Secondary | ICD-10-CM | POA: Insufficient documentation

## 2015-12-06 DIAGNOSIS — H35021 Exudative retinopathy, right eye: Secondary | ICD-10-CM | POA: Insufficient documentation

## 2015-12-06 DIAGNOSIS — I6523 Occlusion and stenosis of bilateral carotid arteries: Secondary | ICD-10-CM | POA: Diagnosis not present

## 2015-12-08 ENCOUNTER — Telehealth: Payer: Self-pay | Admitting: Family Medicine

## 2015-12-08 DIAGNOSIS — E041 Nontoxic single thyroid nodule: Secondary | ICD-10-CM | POA: Insufficient documentation

## 2015-12-08 NOTE — Telephone Encounter (Signed)
Ultrasound of the blood vessels in the neck do not show any blockages. You do have plaque buildup a bit however that may be a problem later. I dont think your eye symptoms have anything to do with your neck.  The best plan is to continue Crestor and blood pressure control.   However we were able to see a thyroid cyst while we were looking. The radiologist recommends a dedicated thyroid ultrasound to evaluate this cyst better.   I will order this test.

## 2015-12-10 NOTE — Telephone Encounter (Signed)
Pt advised of results and recommendations. Pt is OK with US. Imaging notified and will contact Pt to schedule. No further questions/concerns at this time.

## 2015-12-11 ENCOUNTER — Ambulatory Visit (HOSPITAL_BASED_OUTPATIENT_CLINIC_OR_DEPARTMENT_OTHER)
Admission: RE | Admit: 2015-12-11 | Discharge: 2015-12-11 | Disposition: A | Discharge status: Home / Self Care | Payer: Federal, State, Local not specified - PPO | Source: Ambulatory Visit | Attending: Family Medicine | Admitting: Family Medicine

## 2015-12-11 DIAGNOSIS — E042 Nontoxic multinodular goiter: Secondary | ICD-10-CM | POA: Diagnosis not present

## 2015-12-11 DIAGNOSIS — E041 Nontoxic single thyroid nodule: Secondary | ICD-10-CM | POA: Diagnosis not present

## 2015-12-12 ENCOUNTER — Telehealth: Payer: Self-pay | Admitting: Family Medicine

## 2015-12-12 DIAGNOSIS — E041 Nontoxic single thyroid nodule: Secondary | ICD-10-CM

## 2015-12-12 NOTE — Telephone Encounter (Signed)
Thyroid ultrasound found a nodule on the left side that was a bit concerning.  They recommend a thyroid biopsy which I have ordered.  You should hear form them soon.   I also recommend getting some thyroid labs in the near future.

## 2015-12-15 NOTE — Telephone Encounter (Signed)
US completed. Pt scheduled fo FU on 12/17/15.

## 2015-12-17 ENCOUNTER — Ambulatory Visit: Payer: Self-pay | Admitting: Family Medicine

## 2015-12-22 ENCOUNTER — Other Ambulatory Visit: Payer: Self-pay | Admitting: Family Medicine

## 2015-12-23 ENCOUNTER — Ambulatory Visit
Admission: RE | Admit: 2015-12-23 | Discharge: 2015-12-23 | Disposition: A | Discharge status: Home / Self Care | Payer: Federal, State, Local not specified - PPO | Source: Ambulatory Visit | Attending: Family Medicine | Admitting: Family Medicine

## 2015-12-23 ENCOUNTER — Other Ambulatory Visit (HOSPITAL_COMMUNITY)
Admission: RE | Admit: 2015-12-23 | Discharge: 2015-12-23 | Disposition: A | Discharge status: Home / Self Care | Payer: Federal, State, Local not specified - PPO | Source: Ambulatory Visit | Attending: Radiology | Admitting: Radiology

## 2015-12-23 DIAGNOSIS — E041 Nontoxic single thyroid nodule: Secondary | ICD-10-CM | POA: Insufficient documentation

## 2015-12-26 ENCOUNTER — Telehealth: Payer: Self-pay | Admitting: Family Medicine

## 2015-12-26 NOTE — Telephone Encounter (Signed)
Patient called asking about US Thyroid she had on 12/23/15 and had not heard anything adv doesn't have any notes will send a message. Pt would like a call back regarding results on Monday if possible. Thanks

## 2015-12-29 NOTE — Telephone Encounter (Signed)
It looks like we tried to call on November 3 and left a message. If this is not the case please let me know. Pathology from thyroid biopsy does not show cancer.

## 2016-03-26 ENCOUNTER — Other Ambulatory Visit: Payer: Self-pay

## 2016-03-26 MED ORDER — BENAZEPRIL HCL 20 MG PO TABS: 20.0000 mg | ORAL_TABLET | Freq: Every day | ORAL | 1 refills | Status: DC

## 2016-03-26 MED ORDER — AMLODIPINE BESYLATE 10 MG PO TABS: 10.0000 mg | ORAL_TABLET | Freq: Every day | ORAL | 1 refills | Status: DC

## 2016-04-27 ENCOUNTER — Ambulatory Visit: Payer: Federal, State, Local not specified - PPO | Admitting: Family Medicine

## 2016-05-17 ENCOUNTER — Ambulatory Visit (INDEPENDENT_AMBULATORY_CARE_PROVIDER_SITE_OTHER): Payer: Federal, State, Local not specified - PPO

## 2016-05-17 ENCOUNTER — Ambulatory Visit (INDEPENDENT_AMBULATORY_CARE_PROVIDER_SITE_OTHER): Payer: Federal, State, Local not specified - PPO | Admitting: Podiatry

## 2016-05-17 DIAGNOSIS — M79674 Pain in right toe(s): Secondary | ICD-10-CM

## 2016-05-17 DIAGNOSIS — G5791 Unspecified mononeuropathy of right lower limb: Secondary | ICD-10-CM

## 2016-05-17 DIAGNOSIS — G5761 Lesion of plantar nerve, right lower limb: Secondary | ICD-10-CM

## 2016-05-21 NOTE — Progress Notes (Signed)
   Subjective:  Patient presents today for evaluation of nerve pain between the second and third toe of the right foot that is been going on for 3-4 years now. Patient has been seen by previous physicians and diagnosed with neuroma. All conservative modalities of been unsuccessful in providing any sort of satisfactory alleviation of symptoms for the patient.    Objective/Physical Exam General: The patient is alert and oriented x3 in no acute distress.  Dermatology: Skin is warm, dry and supple bilateral lower extremities. Negative for open lesions or macerations.  Vascular: Palpable pedal pulses bilaterally. No edema or erythema noted. Capillary refill within normal limits.  Neurological: Epicritic and protective threshold grossly intact bilaterally.   Musculoskeletal Exam: Pain on palpation of the intermetatarsal space between metatarsals 2-3 of the right foot with lateral compression as well. There is an audible clicking consistent with Morton's neuroma and positive Mulder sign. Range of motion within normal limits to all pedal and ankle joints bilateral. Muscle strength 5/5 in all groups bilateral.   Radiographic Exam:  Normal osseous mineralization. Joint spaces preserved. No fracture/dislocation/boney destruction.    Assessment: #1 Morton's neuroma right foot between the second and third metatarsal heads times 3-4 years   Plan of Care:  #1 Patient was evaluated. #2 today the patient was scanned for custom molded orthotics #3 return to clinic in 3 weeks for orthotic pickup #4 orthotics will be a last attempt for conservative management of the forefoot neuroma pain. If there is no improvement with the orthotics we will proceed with surgery which will include excision of Morton's neuroma right foot.   Felecia Shelling, DPM [facility]  Dr. Felecia Shelling, DPM    [address]                                        [place], [place] [address]                Office  [phone]  Fax [phone]

## 2016-06-07 ENCOUNTER — Ambulatory Visit (INDEPENDENT_AMBULATORY_CARE_PROVIDER_SITE_OTHER): Payer: Federal, State, Local not specified - PPO | Admitting: Podiatry

## 2016-06-07 DIAGNOSIS — G5761 Lesion of plantar nerve, right lower limb: Secondary | ICD-10-CM

## 2016-06-07 NOTE — Patient Instructions (Signed)

## 2016-06-14 NOTE — Progress Notes (Signed)
Patient presents today to pick up her custom molded orthotics. Fitting as well as break-in instructions were provided both verbally and written. Return to clinic in 4 weeks for follow-up.  Felecia Shelling, DPM [facility]  Dr. Felecia Shelling, DPM    [address]                                        [place], [place] [address]                Office [phone]  Fax [phone]

## 2016-06-21 ENCOUNTER — Telehealth: Payer: Self-pay | Admitting: *Deleted

## 2016-06-21 ENCOUNTER — Other Ambulatory Visit: Payer: Self-pay

## 2016-06-21 ENCOUNTER — Ambulatory Visit: Payer: Federal, State, Local not specified - PPO | Admitting: Podiatry

## 2016-06-21 MED ORDER — ROSUVASTATIN CALCIUM 40 MG PO TABS: 40.0000 mg | ORAL_TABLET | Freq: Every day | ORAL | 0 refills | Status: DC

## 2016-06-21 NOTE — Telephone Encounter (Signed)
closed

## 2016-07-01 ENCOUNTER — Encounter: Payer: Self-pay | Admitting: Family Medicine

## 2016-08-17 ENCOUNTER — Ambulatory Visit (INDEPENDENT_AMBULATORY_CARE_PROVIDER_SITE_OTHER): Payer: Federal, State, Local not specified - PPO | Admitting: Family Medicine

## 2016-08-17 ENCOUNTER — Encounter: Payer: Self-pay | Admitting: Family Medicine

## 2016-08-17 VITALS — BP 131/77 | HR 58 | Ht 64.75 in | Wt 153.0 lb

## 2016-08-17 DIAGNOSIS — R3129 Other microscopic hematuria: Secondary | ICD-10-CM

## 2016-08-17 DIAGNOSIS — E041 Nontoxic single thyroid nodule: Secondary | ICD-10-CM | POA: Diagnosis not present

## 2016-08-17 DIAGNOSIS — Z0000 Encounter for general adult medical examination without abnormal findings: Secondary | ICD-10-CM | POA: Diagnosis not present

## 2016-08-17 DIAGNOSIS — I10 Essential (primary) hypertension: Secondary | ICD-10-CM

## 2016-08-17 DIAGNOSIS — Z1211 Encounter for screening for malignant neoplasm of colon: Secondary | ICD-10-CM | POA: Diagnosis not present

## 2016-08-17 DIAGNOSIS — E785 Hyperlipidemia, unspecified: Secondary | ICD-10-CM

## 2016-08-17 LAB — CBC
HEMATOCRIT: 39.9 % (ref 35.0–45.0)
Hemoglobin: 13.1 g/dL (ref 11.7–15.5)
MCH: 28.2 pg (ref 27.0–33.0)
MCHC: 32.8 g/dL (ref 32.0–36.0)
MCV: 85.8 fL (ref 80.0–100.0)
MPV: 10.9 fL (ref 7.5–12.5)
PLATELETS: 274 10*3/uL (ref 140–400)
RBC: 4.65 MIL/uL (ref 3.80–5.10)
RDW: 14.5 % (ref 11.0–15.0)
WBC: 6.1 10*3/uL (ref 3.8–10.8)

## 2016-08-17 MED ORDER — ZOSTER VAC RECOMB ADJUVANTED 50 MCG/0.5ML IM SUSR: 0.5000 mL | Freq: Once | INTRAMUSCULAR | 1 refills | Status: AC

## 2016-08-17 MED ORDER — BENAZEPRIL HCL 20 MG PO TABS: 20.0000 mg | ORAL_TABLET | Freq: Every day | ORAL | 3 refills | Status: DC

## 2016-08-17 MED ORDER — AMLODIPINE BESYLATE 10 MG PO TABS: 10.0000 mg | ORAL_TABLET | Freq: Every day | ORAL | 3 refills | Status: DC

## 2016-08-17 MED ORDER — ROSUVASTATIN CALCIUM 40 MG PO TABS: 40.0000 mg | ORAL_TABLET | Freq: Every day | ORAL | 3 refills | Status: DC

## 2016-08-17 NOTE — Progress Notes (Signed)
Kristine Mclaughlin is a 61 y.o. female who [facility] today for well adult visit. Patient has a history of hypertension and hyperlipidemia with a family history for hypertension hyperlipidemia and diabetes. She exercises regularly and tries to eat a balanced diet. She denies fevers or chills vomiting or diarrhea. She thinks that she is probably due for colonoscopy. Her last Pap smear was in 2016 and was reportedly normal.   Past Medical History:  Diagnosis Date  . Hyperlipidemia   . Hypertension    Past Surgical History:  Procedure Laterality Date  . BREAST SURGERY  1992   lumpectomy--no CA   Social History  Substance Use Topics  . Smoking status: Never Smoker  . Smokeless tobacco: Never Used  . Alcohol use 0.6 oz/week    1 Cans of beer per week   family history includes Diabetes in her brother and father; Heart disease (age of onset: 556) in her brother; Heart disease (age of onset: 4362) in her father; Hyperlipidemia in her brother and father; Hypertension in her brother and father.  ROS as above:  Medications: Current Outpatient Prescriptions  Medication Sig Dispense Refill  . amLODipine (NORVASC) 10 MG tablet Take 1 tablet (10 mg total) by mouth daily. 90 tablet 3  . benazepril (LOTENSIN) 20 MG tablet Take 1 tablet (20 mg total) by mouth daily. 90 tablet 3  . MEGARED OMEGA-3 KRILL OIL PO Take 600 mg by mouth.    . Multiple Vitamin (THERA) TABS Take 1 tablet by mouth.    . rosuvastatin (CRESTOR) 40 MG tablet Take 1 tablet (40 mg total) by mouth daily. Use generic 90 tablet 3  . Turmeric 450 MG CAPS Take by mouth.    . Zoster Vac Recomb Adjuvanted [facility](SHINGRIX) injection Inject 0.5 mLs into the muscle once. Give again after 2 months. If administered in clinic please fax report to Dr Denyse Amassorey [phone] 1 each 1   No current facility-administered  medications for this visit.    No Known Allergies  Health Maintenance Health Maintenance  Topic Date Due  . PAP SMEAR  08/25/2015  . INFLUENZA VACCINE  09/22/2016  . MAMMOGRAM  10/20/2016  . COLONOSCOPY  10/01/2018  . TETANUS/TDAP  10/30/2024  . Hepatitis C Screening  Completed  . HIV Screening  Completed     Exam:  BP 131/77   Pulse (!) 58   Ht 5' 4.75" (1.645 m)   Wt 153 lb (69.4 kg)   SpO2 100%   BMI 25.66 kg/m   Gen: Well NAD HEENT: EOMI,  MMM Lungs: Normal work of breathing. CTABL Heart: RRR no MRG Abd: NABS, Soft. Nondistended, Nontender Exts: Brisk capillary refill, warm and well perfused.    No results found for this or any previous visit (from the past 72 hour(s)). No results found.    Assessment and Plan: 61 y.o. female with well adult visit. Doing reasonably well. Check basic fasting labs as well as follow-up thyroid labs for recent thyroid nodule and urinalysis for microscopic hematuria. Referring-neurology for follow-up colonoscopy. Recheck in the near future as needed. Encouraged healthy lifestyle habits.   Orders Placed This Encounter  Procedures  . CBC  . COMPLETE METABOLIC PANEL WITH GFR  . Lipid Panel w/reflex Direct LDL  . TSH  . T4, free  . T3, free  . Urinalysis, Routine w reflex microscopic  . Ambulatory referral to Gastroenterology    Referral Priority:   Routine  Referral Type:   Consultation    Referral Reason:   Specialty Services Required    Requested Specialty:   Gastroenterology    Number of Visits Requested:   1   Meds ordered this encounter  Medications  . DISCONTD: benazepril (LOTENSIN) 20 MG tablet    Sig: Take 20 mg by mouth daily.   Marland Kitchen DISCONTD: amLODipine (NORVASC) 10 MG tablet    Sig: Take 10 mg by mouth daily.  . Multiple Vitamin (THERA) TABS    Sig: Take 1 tablet by mouth.  Marland Kitchen MEGARED OMEGA-3 KRILL OIL PO    Sig: Take 600 mg by mouth.  . Turmeric 450 MG CAPS    Sig: Take by mouth.  . Zoster Vac Recomb  Adjuvanted [facility]) injection    Sig: Inject 0.5 mLs into the muscle once. Give again after 2 months. If administered in clinic please fax report to Dr Denyse Amass [phone]    Dispense:  1 each    Refill:  1  . amLODipine (NORVASC) 10 MG tablet    Sig: Take 1 tablet (10 mg total) by mouth daily.    Dispense:  90 tablet    Refill:  3  . benazepril (LOTENSIN) 20 MG tablet    Sig: Take 1 tablet (20 mg total) by mouth daily.    Dispense:  90 tablet    Refill:  3  . rosuvastatin (CRESTOR) 40 MG tablet    Sig: Take 1 tablet (40 mg total) by mouth daily. Use generic    Dispense:  90 tablet    Refill:  3     Discussed warning signs or symptoms. Please see discharge instructions. Patient expresses understanding.

## 2016-08-17 NOTE — Patient Instructions (Signed)
Thank you for coming in today. Recheck year as needed.  You should hear from [facility] soonish.  Get fasting labs in the near future.   We will send results.

## 2016-08-18 LAB — URINALYSIS, ROUTINE W REFLEX MICROSCOPIC
BILIRUBIN URINE: NEGATIVE
GLUCOSE, UA: NEGATIVE
Ketones, ur: NEGATIVE
Leukocytes, UA: NEGATIVE
Nitrite: NEGATIVE
PROTEIN: NEGATIVE
Specific Gravity, Urine: 1.02 (ref 1.001–1.035)
pH: 5.5 (ref 5.0–8.0)

## 2016-08-18 LAB — T4, FREE: FREE T4: 1 ng/dL (ref 0.8–1.8)

## 2016-08-18 LAB — COMPLETE METABOLIC PANEL WITH GFR
ALT: 18 U/L (ref 6–29)
AST: 18 U/L (ref 10–35)
Albumin: 4.6 g/dL (ref 3.6–5.1)
Alkaline Phosphatase: 53 U/L (ref 33–130)
BUN: 14 mg/dL (ref 7–25)
CHLORIDE: 101 mmol/L (ref 98–110)
CO2: 26 mmol/L (ref 20–31)
CREATININE: 0.83 mg/dL (ref 0.50–1.05)
Calcium: 9.6 mg/dL (ref 8.6–10.4)
GFR, Est African American: 89 mL/min (ref >=60)
GFR, Est Non African American: 77 mL/min (ref >=60)
Glucose, Bld: 92 mg/dL (ref 65–99)
Potassium: 4.1 mmol/L (ref 3.5–5.3)
Sodium: 137 mmol/L (ref 135–146)
Total Bilirubin: 0.8 mg/dL (ref 0.2–1.2)
Total Protein: 7.2 g/dL (ref 6.1–8.1)

## 2016-08-18 LAB — LIPID PANEL W/REFLEX DIRECT LDL
Cholesterol: 243 mg/dL — ABNORMAL HIGH (ref <200)
HDL: 86 mg/dL (ref >50)
LDL-CHOLESTEROL: 139 mg/dL — AB
NON-HDL CHOLESTEROL (CALC): 157 mg/dL — AB (ref <130)
Total CHOL/HDL Ratio: 2.8 Ratio (ref <5.0)
Triglycerides: 85 mg/dL (ref <150)

## 2016-08-18 LAB — URINALYSIS, MICROSCOPIC ONLY
Bacteria, UA: NONE SEEN [HPF]
Casts: NONE SEEN [LPF]
Crystals: NONE SEEN [HPF]
Squamous Epithelial / LPF: NONE SEEN [HPF] (ref <=5)
WBC, UA: NONE SEEN WBC/HPF (ref <=5)
YEAST: NONE SEEN [HPF]

## 2016-08-18 LAB — T3, FREE: T3 FREE: 3.1 pg/mL (ref 2.3–4.2)

## 2016-08-18 LAB — TSH: TSH: 0.46 m[IU]/L

## 2016-09-02 ENCOUNTER — Encounter: Payer: Self-pay | Admitting: Family Medicine

## 2016-09-23 ENCOUNTER — Other Ambulatory Visit: Payer: Self-pay | Admitting: Family Medicine

## 2016-09-23 DIAGNOSIS — Z1231 Encounter for screening mammogram for malignant neoplasm of breast: Secondary | ICD-10-CM

## 2016-09-28 ENCOUNTER — Ambulatory Visit
Admission: RE | Admit: 2016-09-28 | Discharge: 2016-09-28 | Disposition: A | Discharge status: Home / Self Care | Payer: Federal, State, Local not specified - PPO | Source: Ambulatory Visit | Attending: Family Medicine | Admitting: Family Medicine

## 2016-09-28 DIAGNOSIS — Z1231 Encounter for screening mammogram for malignant neoplasm of breast: Secondary | ICD-10-CM | POA: Diagnosis not present

## 2016-10-28 ENCOUNTER — Encounter: Payer: Self-pay | Admitting: Family Medicine

## 2016-12-20 DIAGNOSIS — D120 Benign neoplasm of cecum: Secondary | ICD-10-CM | POA: Diagnosis not present

## 2016-12-20 DIAGNOSIS — Z86010 Personal history of colonic polyps: Secondary | ICD-10-CM | POA: Diagnosis not present

## 2016-12-20 LAB — HM COLONOSCOPY

## 2017-01-25 DIAGNOSIS — H31011 Macula scars of posterior pole (postinflammatory) (post-traumatic), right eye: Secondary | ICD-10-CM | POA: Diagnosis not present

## 2017-02-02 ENCOUNTER — Telehealth: Payer: Self-pay | Admitting: Family Medicine

## 2017-02-02 NOTE — Telephone Encounter (Signed)
Patient called adv that she would like to start getting B-12 injections she has been feeling sluggish. Adv pt will send a message advising may have to get blood work. Pt has a nurse visit next Wed for flu shot req a call regarding B-12? Thanks

## 2017-02-03 NOTE — Telephone Encounter (Signed)
B12 deficiency is rarely the cause of fatigue.  We would really need to test for this before starting treatment.  I recommend that you return to clinic so we can look into the cause of fatigue can come up with the right treatment to get you feeling better

## 2017-02-03 NOTE — Telephone Encounter (Signed)
Called patient gave her advise as noted below. Patient refused to come in for an appointment and stated that she would just get it from elsewhere. Shakira Los,CMA

## 2017-02-09 ENCOUNTER — Ambulatory Visit: Payer: Federal, State, Local not specified - PPO

## 2017-02-10 DIAGNOSIS — L821 Other seborrheic keratosis: Secondary | ICD-10-CM | POA: Diagnosis not present

## 2017-03-16 ENCOUNTER — Encounter: Payer: Self-pay | Admitting: Family Medicine

## 2017-06-28 ENCOUNTER — Encounter: Payer: Self-pay | Admitting: Family Medicine

## 2017-06-28 ENCOUNTER — Ambulatory Visit (INDEPENDENT_AMBULATORY_CARE_PROVIDER_SITE_OTHER): Payer: Federal, State, Local not specified - PPO | Admitting: Family Medicine

## 2017-06-28 VITALS — BP 115/76 | HR 64 | Temp 98.3°F | Wt 157.7 lb

## 2017-06-28 DIAGNOSIS — M41125 Adolescent idiopathic scoliosis, thoracolumbar region: Secondary | ICD-10-CM

## 2017-06-28 NOTE — Patient Instructions (Addendum)
I will talk with Kristine Mclaughlin at [facility] about back brace.  Recheck with me for physical in August around your birthday.  Continue exercise and healthy lifestyle.   Continue exercise.   Physical on or around your birthday.  If you send me a message about 1-2 weeks ahead of time we will get labs before the visit.

## 2017-06-28 NOTE — Progress Notes (Signed)
Kristine Mclaughlin is a 62 y.o. female who presents to [facility] today for back pain.  Uliana has chronic intermittent lumbar and thoracic back pain.  This is due to mild scoliosis and DDD.  She had a episode of physical therapy and continues to do her home exercise program fitness program regularly.  She notes she does have episodes of pain but overall is doing quite well.  She additionally needs her FMLA paperwork updated.    Past Medical History:  Diagnosis Date  . Hyperlipidemia   . Hypertension    Past Surgical History:  Procedure Laterality Date  . BREAST SURGERY  1992   lumpectomy--no CA   Social History   Tobacco Use  . Smoking status: Never Smoker  . Smokeless tobacco: Never Used  Substance Use Topics  . Alcohol use: Yes    Alcohol/week: 0.6 oz    Types: 1 Cans of beer per week     ROS:  As above   Medications: Current Outpatient Medications  Medication Sig Dispense Refill  . amLODipine (NORVASC) 10 MG tablet Take 1 tablet (10 mg total) by mouth daily. 90 tablet 3  . benazepril (LOTENSIN) 20 MG tablet Take 1 tablet (20 mg total) by mouth daily. 90 tablet 3  . MEGARED OMEGA-3 KRILL OIL PO Take 600 mg by mouth.    . Multiple Vitamin (THERA) TABS Take 1 tablet by mouth.    . rosuvastatin (CRESTOR) 40 MG tablet Take 1 tablet (40 mg total) by mouth daily. Use generic 90 tablet 3  . Turmeric 450 MG CAPS Take by mouth.     No current facility-administered medications for this visit.    No Known Allergies   Exam:  BP 115/76 (BP Location: Left Arm, Patient Position: Sitting, Cuff Size: Normal)   Pulse 64   Temp 98.3 F (36.8 C) (Oral)   Wt 157 lb 11.2 oz (71.5 kg)   BMI 26.45 kg/m  General: Well Developed, well nourished, and in no acute distress.  Neuro/Psych: Alert and oriented x3, extra-ocular muscles intact, able to move all 4 extremities, sensation grossly intact. Skin: Warm and dry, no rashes noted.    Respiratory: Not using accessory muscles, speaking in full sentences, trachea midline.  Cardiovascular: Pulses palpable, no extremity edema. Abdomen: Does not appear distended. MSK:  TL spine: Slight curvature.  Nontender to midline.  Mildly tender palpation right thoracic paraspinal muscle group and inferior periscapular muscles. Normal back motion.  Lower extremity strength is intact.  Normal gait.  EXAM: LUMBAR SPINE - COMPLETE 4+ VIEW  COMPARISON:  None.  FINDINGS: There is no evidence of lumbar spine fracture. There is scoliosis of spine. There is mild decreased intervertebral space at L5-S1. There is minimal anterior osteophytosis at L3 and L4.  IMPRESSION: Scoliosis. Minimal degenerative joint changes of lower lumbar spine.   Electronically Signed   By: Sherian Rein M.D.   On: 08/23/2014 17:46 I personally (independently) visualized and performed the interpretation of the images attached in this note.     Assessment and Plan: 62 y.o. female with Lumbar pain due to scoliosis and DDD. Doing well. Continue current regimen and FMLA.  Recheck for well adult later this year.     No orders of the defined types were placed in this encounter.  No orders of the defined types were placed in this encounter.   Discussed warning signs or symptoms. Please see discharge instructions. Patient expresses understanding.  I spent 15 minutes with  this patient, greater than 50% was face-to-face time counseling regarding ddx and treatment plan.

## 2017-09-26 ENCOUNTER — Other Ambulatory Visit: Payer: Self-pay | Admitting: Family Medicine

## 2017-10-11 ENCOUNTER — Ambulatory Visit (INDEPENDENT_AMBULATORY_CARE_PROVIDER_SITE_OTHER): Payer: Federal, State, Local not specified - PPO | Admitting: Physical Medicine and Rehabilitation

## 2017-10-19 ENCOUNTER — Encounter: Payer: Federal, State, Local not specified - PPO | Admitting: Family Medicine

## 2017-10-31 IMAGING — MG 2D DIGITAL SCREENING BILATERAL MAMMOGRAM WITH CAD AND ADJUNCT TO
8 of 12 series · 8 of 28 positions shown · non-contrast
Comparison: Previous exam(s).

CLINICAL DATA: Screening.

EXAM:
2D DIGITAL SCREENING BILATERAL MAMMOGRAM WITH CAD AND ADJUNCT TOMO

[L CC]
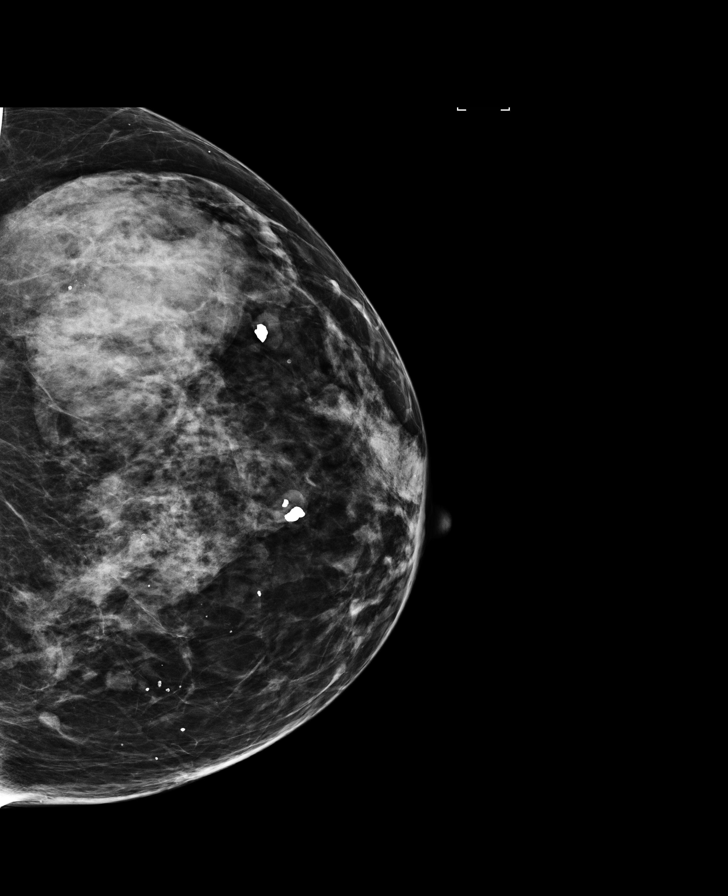

[L CC synth-2D]
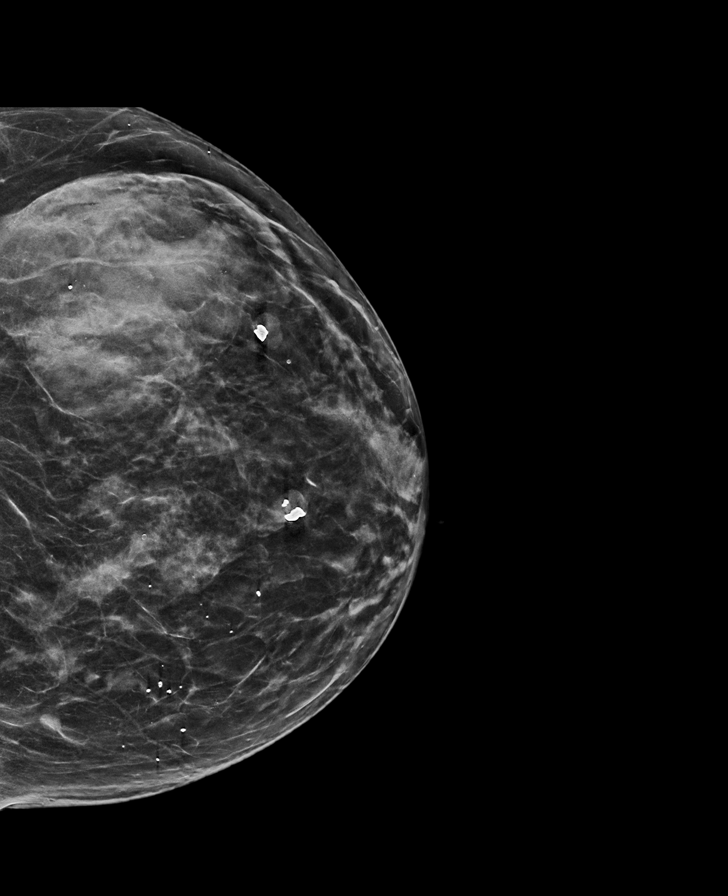

[R MLO]
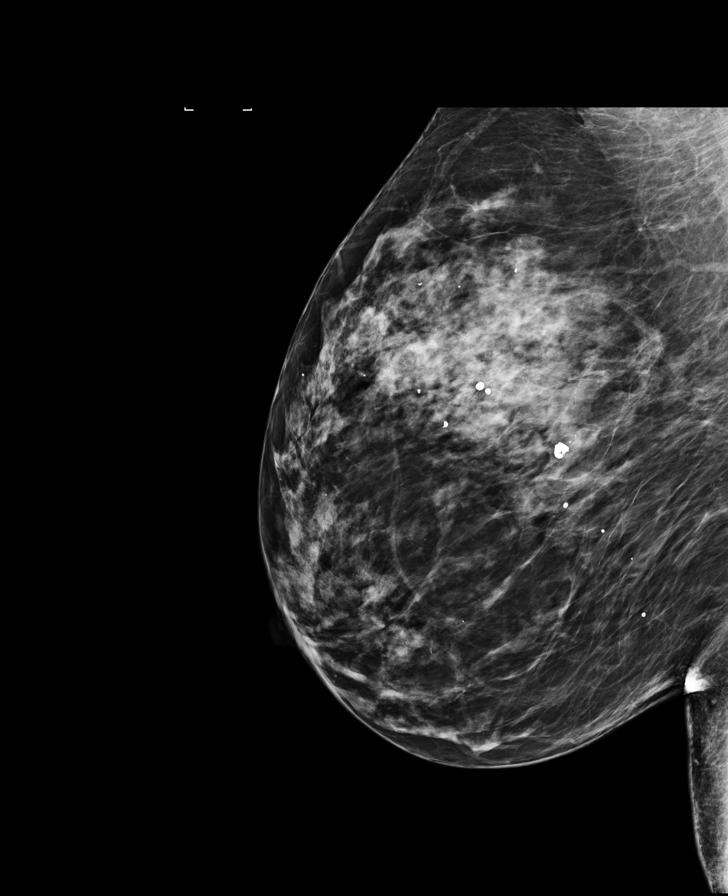

[R MLO synth-2D]
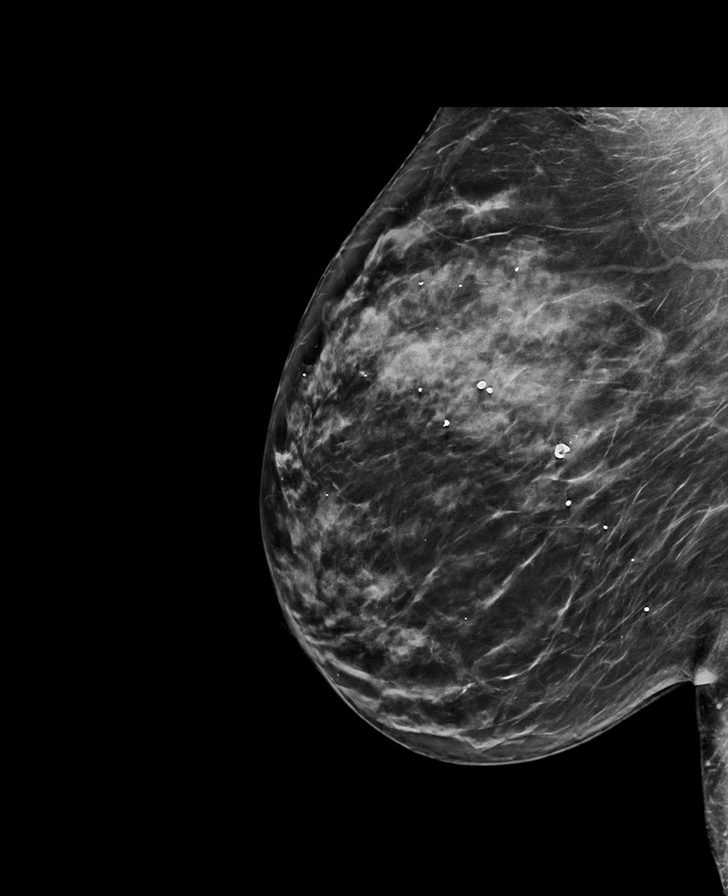

[L MLO]
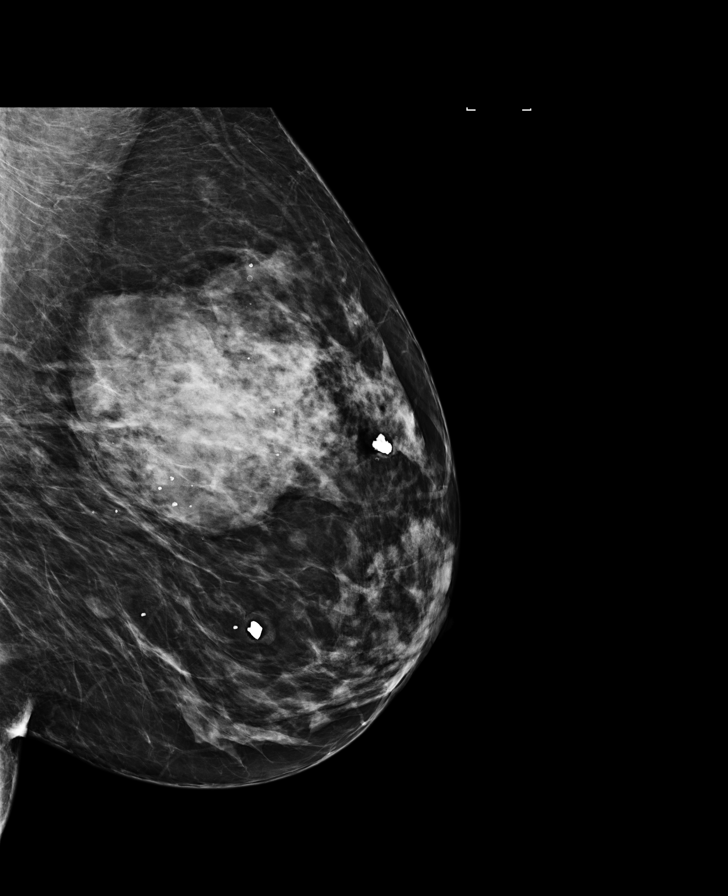

[L MLO synth-2D]
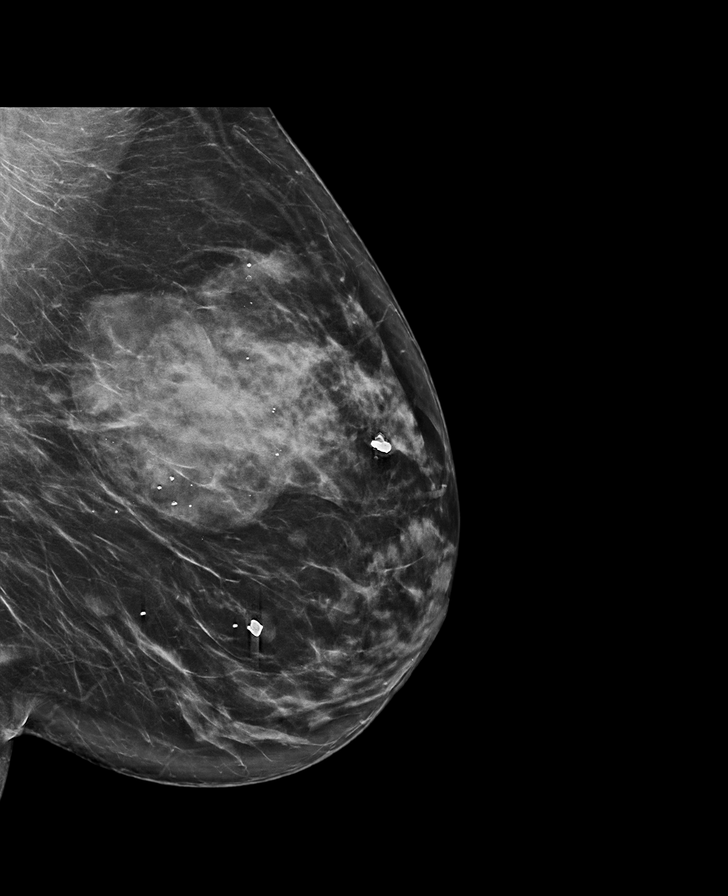

[R CC synth-2D]
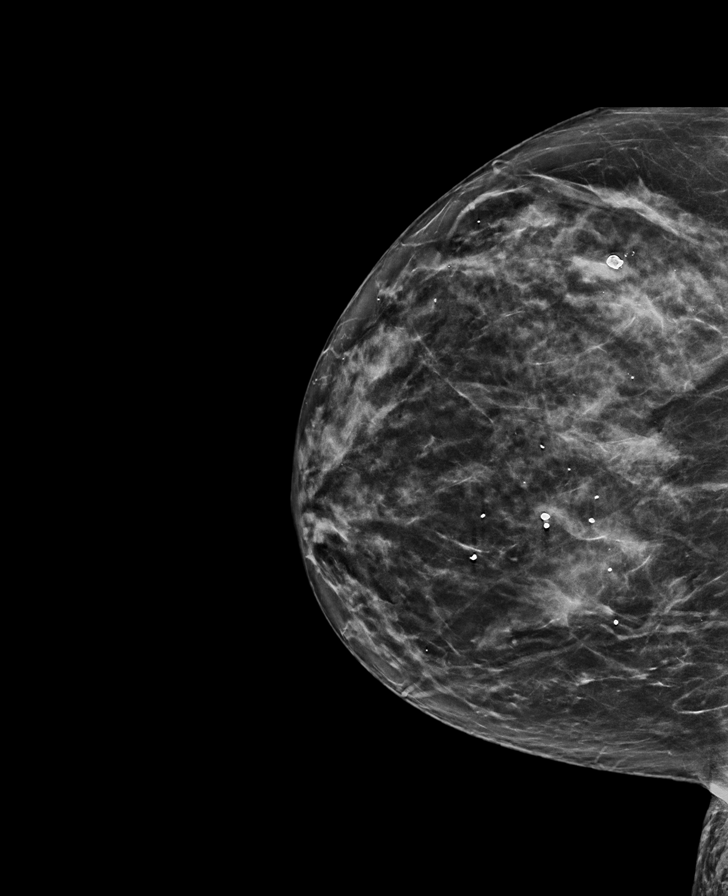

[R CC]
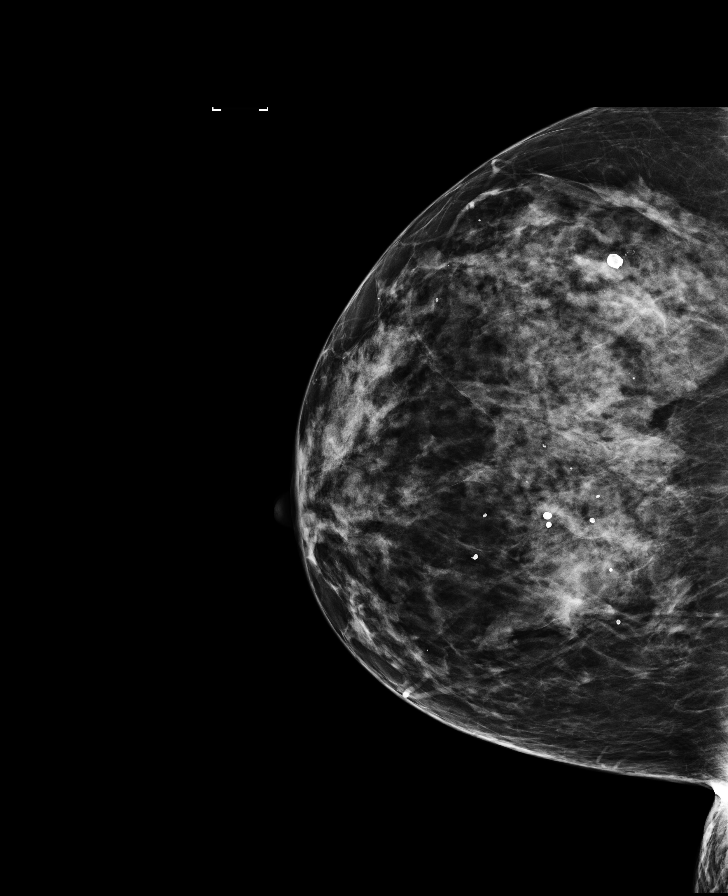

[8 of 28 positions shown; findings below may reference images not displayed]

ACR Breast Density Category c: The breast tissue is heterogeneously
dense, which may obscure small masses.
FINDINGS: There are no findings suspicious for malignancy. Images were
processed with CAD.
IMPRESSION: No mammographic evidence of malignancy. A result letter of this
screening mammogram will be mailed directly to the patient.

RECOMMENDATION:
Screening mammogram in one year. (Code:TN-[id number])

BI-RADS CATEGORY  1: Negative.

## 2017-11-01 ENCOUNTER — Encounter: Payer: Self-pay | Admitting: Family Medicine

## 2017-11-01 ENCOUNTER — Ambulatory Visit (INDEPENDENT_AMBULATORY_CARE_PROVIDER_SITE_OTHER): Payer: Federal, State, Local not specified - PPO | Admitting: Family Medicine

## 2017-11-01 VITALS — BP 128/76 | HR 66 | Ht 66.0 in | Wt 161.0 lb

## 2017-11-01 DIAGNOSIS — I10 Essential (primary) hypertension: Secondary | ICD-10-CM

## 2017-11-01 DIAGNOSIS — Z1239 Encounter for other screening for malignant neoplasm of breast: Secondary | ICD-10-CM

## 2017-11-01 DIAGNOSIS — E785 Hyperlipidemia, unspecified: Secondary | ICD-10-CM

## 2017-11-01 DIAGNOSIS — Z0000 Encounter for general adult medical examination without abnormal findings: Secondary | ICD-10-CM

## 2017-11-01 DIAGNOSIS — Z1231 Encounter for screening mammogram for malignant neoplasm of breast: Secondary | ICD-10-CM | POA: Diagnosis not present

## 2017-11-01 MED ORDER — ROSUVASTATIN CALCIUM 40 MG PO TABS: ORAL_TABLET | ORAL | 3 refills | Status: DC

## 2017-11-01 MED ORDER — AMLODIPINE BESYLATE 10 MG PO TABS: 10.0000 mg | ORAL_TABLET | Freq: Every day | ORAL | 3 refills | Status: DC

## 2017-11-01 MED ORDER — BENAZEPRIL HCL 20 MG PO TABS: 20.0000 mg | ORAL_TABLET | Freq: Every day | ORAL | 3 refills | Status: DC

## 2017-11-01 NOTE — Patient Instructions (Addendum)
Thank you for coming in today. Get fasting labs in the near future.   Continue current medicine.   Schedule a visit with Dr Lyn Hollingshead for pap smear  You can schedule the mammogram on the same day.   Let me know if you need to do more about your back.

## 2017-11-01 NOTE — Progress Notes (Signed)
Kristine Mclaughlin is a 62 y.o. female who presents to [facility] today for well adult visit.   Kristine Mclaughlin is doing well overall.  She exercises regularly and tries to eat a healthy diet.  She has persistent bothersome thoracic and lumbar back pain.  She notes that this is mild to moderate and is mildly nauseous.  She is tried some physical therapy as well as chiropractic care.  She does not want to pursue it much further but is interested in knowing what her options might be in the future.  She takes medication listed below with no significant problems or side effects.  She notes that she is due for Pap smear and would like to have a female provider perform this service if possible.   ROS as above:  Past Medical History:  Diagnosis Date  . Hyperlipidemia   . Hypertension    Past Surgical History:  Procedure Laterality Date  . BREAST SURGERY  1992   lumpectomy--no CA   Social History   Tobacco Use  . Smoking status: Never Smoker  . Smokeless tobacco: Never Used  Substance Use Topics  . Alcohol use: Yes    Alcohol/week: 1.0 standard drinks    Types: 1 Cans of beer per week   family history includes Coronary artery disease in her unknown relative; Diabetes in her brother and father; Heart disease (age of onset: 89) in her brother; Heart disease (age of onset: 66) in her father; Hyperlipidemia in her brother and father; Hypertension in her brother and father.  Medications: Current Outpatient Medications  Medication Sig Dispense Refill  . amLODipine (NORVASC) 10 MG tablet Take 1 tablet (10 mg total) by mouth daily. 90 tablet 3  . benazepril (LOTENSIN) 20 MG tablet Take 1 tablet (20 mg total) by mouth daily. 90 tablet 3  . MEGARED OMEGA-3 KRILL OIL PO Take 600 mg by mouth.    . Multiple Vitamin (THERA) TABS Take 1 tablet by mouth.    . rosuvastatin (CRESTOR) 40 MG  tablet Take 1 tablet (40 mg total) by mouth daily. 90 tablet 3  . Turmeric 450 MG CAPS Take by mouth.    . Zoster Vaccine Adjuvanted [facility]) injection Inject into the muscle.     No current facility-administered medications for this visit.    No Known Allergies  Health Maintenance Health Maintenance  Topic Date Due  . PAP SMEAR  10/14/2017  . INFLUENZA VACCINE  06/21/2018 (Originally 09/22/2017)  . MAMMOGRAM  09/29/2018  . TETANUS/TDAP  10/30/2024  . COLONOSCOPY  12/21/2026  . Hepatitis C Screening  Completed  . HIV Screening  Completed     Exam:  BP 128/76   Pulse 66   Ht 5\' 6"  (1.676 m)   Wt 161 lb (73 kg)   BMI 25.99 kg/m  Wt Readings from Last 5 Encounters:  11/01/17 161 lb (73 kg)  06/28/17 157 lb 11.2 oz (71.5 kg)  08/17/16 153 lb (69.4 kg)  07/08/15 154 lb (69.9 kg)  04/08/15 158 lb (71.7 kg)     Gen: Well NAD HEENT: EOMI,  MMM Lungs: Normal work of breathing. CTABL Heart: RRR no MRG Abd: NABS, Soft. Nondistended, Nontender Exts: Brisk capillary refill, warm and well perfused.  Psych: Alert and oriented normal speech thought process and affect.  Depression screen [facility] 2/9 11/01/2017 08/17/2016 08/24/2012  Decreased Interest 0 0 0  Down, Depressed, Hopeless 0 0 0  PHQ - 2 Score  0 0 0  Altered sleeping - 0 -  PHQ-9 Score - 0 -       Assessment and Plan: 62 y.o. female with well adult.  Doing well.  Check basic fasting labs listed below.  Scheduled for Pap smear with Dr. Lyn Hollingshead in the near future.  Additionally will set up and arrange for 3D mammogram.  Continue current medications.  Patient would like to delay her flu vaccine to later this year.  Deferred today.  Otherwise patient's basic health maintenance is up-to-date and doing well.   Orders Placed This Encounter  Procedures  . MM 3D SCREEN BREAST BILATERAL    Standing Status:   Future    Standing Expiration Date:   01/02/2019    Order Specific Question:   Reason for Exam (SYMPTOM  OR DIAGNOSIS  REQUIRED)    Answer:   screen breast cancer    Order Specific Question:   Preferred imaging location?    Answer:   [facility]  . CBC  . COMPLETE METABOLIC PANEL WITH GFR  . Lipid Panel w/reflex Direct LDL   Meds ordered this encounter  Medications  . rosuvastatin (CRESTOR) 40 MG tablet    Sig: Take 1 tablet (40 mg total) by mouth daily.    Dispense:  90 tablet    Refill:  3  . benazepril (LOTENSIN) 20 MG tablet    Sig: Take 1 tablet (20 mg total) by mouth daily.    Dispense:  90 tablet    Refill:  3  . amLODipine (NORVASC) 10 MG tablet    Sig: Take 1 tablet (10 mg total) by mouth daily.    Dispense:  90 tablet    Refill:  3     Discussed warning signs or symptoms. Please see discharge instructions. Patient expresses understanding.

## 2017-11-02 DIAGNOSIS — Z0000 Encounter for general adult medical examination without abnormal findings: Secondary | ICD-10-CM | POA: Diagnosis not present

## 2017-11-02 DIAGNOSIS — Z1231 Encounter for screening mammogram for malignant neoplasm of breast: Secondary | ICD-10-CM | POA: Diagnosis not present

## 2017-11-02 DIAGNOSIS — E785 Hyperlipidemia, unspecified: Secondary | ICD-10-CM | POA: Diagnosis not present

## 2017-11-02 DIAGNOSIS — I10 Essential (primary) hypertension: Secondary | ICD-10-CM | POA: Diagnosis not present

## 2017-11-03 ENCOUNTER — Telehealth: Payer: Self-pay | Admitting: Family Medicine

## 2017-11-03 LAB — COMPLETE METABOLIC PANEL WITH GFR
AG Ratio: 1.8 (calc) (ref 1.0–2.5)
ALBUMIN MSPROF: 4.4 g/dL (ref 3.6–5.1)
ALT: 27 U/L (ref 6–29)
AST: 29 U/L (ref 10–35)
Alkaline phosphatase (APISO): 56 U/L (ref 33–130)
BUN: 14 mg/dL (ref 7–25)
CO2: 25 mmol/L (ref 20–32)
CREATININE: 0.95 mg/dL (ref 0.50–0.99)
Calcium: 9.4 mg/dL (ref 8.6–10.4)
Chloride: 100 mmol/L (ref 98–110)
GFR, EST NON AFRICAN AMERICAN: 65 mL/min/{1.73_m2} (ref >=60)
GFR, Est African American: 75 mL/min/{1.73_m2} (ref >=60)
GLOBULIN: 2.4 g/dL (ref 1.9–3.7)
Glucose, Bld: 85 mg/dL (ref 65–99)
Potassium: 4.2 mmol/L (ref 3.5–5.3)
SODIUM: 137 mmol/L (ref 135–146)
Total Bilirubin: 0.7 mg/dL (ref 0.2–1.2)
Total Protein: 6.8 g/dL (ref 6.1–8.1)

## 2017-11-03 LAB — LIPID PANEL W/REFLEX DIRECT LDL
CHOL/HDL RATIO: 3.3 (calc) (ref <5.0)
Cholesterol: 249 mg/dL — ABNORMAL HIGH (ref <200)
HDL: 75 mg/dL (ref >50)
LDL CHOLESTEROL (CALC): 154 mg/dL — AB
NON-HDL CHOLESTEROL (CALC): 174 mg/dL — AB (ref <130)
Triglycerides: 90 mg/dL (ref <150)

## 2017-11-03 LAB — CBC
HEMATOCRIT: 38.2 % (ref 35.0–45.0)
Hemoglobin: 12.8 g/dL (ref 11.7–15.5)
MCH: 28.2 pg (ref 27.0–33.0)
MCHC: 33.5 g/dL (ref 32.0–36.0)
MCV: 84.1 fL (ref 80.0–100.0)
MPV: 11.6 fL (ref 7.5–12.5)
Platelets: 233 10*3/uL (ref 140–400)
RBC: 4.54 10*6/uL (ref 3.80–5.10)
RDW: 13 % (ref 11.0–15.0)
WBC: 6.4 10*3/uL (ref 3.8–10.8)

## 2017-11-03 LAB — SPECIMEN COMPROMISED

## 2017-11-03 NOTE — Telephone Encounter (Signed)
Patient was prescribed Shingrix vaccine last year and never got it after contacting the pharmacy.  Please contact patient and see if she would like to be placed on the Shingrix waiting list for our clinic.

## 2017-11-04 MED ORDER — EZETIMIBE 10 MG PO TABS: 10.0000 mg | ORAL_TABLET | Freq: Every day | ORAL | 1 refills | Status: DC

## 2017-11-04 NOTE — Addendum Note (Signed)
Addended by: Rodolph BongOREY, Alexes Menchaca S on: 11/04/2017 07:55 AM   Modules accepted: Orders

## 2017-11-08 ENCOUNTER — Telehealth: Payer: Self-pay

## 2017-11-08 NOTE — Telephone Encounter (Signed)
-----   Message from Rodolph BongEvan S Corey, MD sent at 11/03/2017  8:48 AM EDT ----- Regarding: Shingrix Please add to shingrix list  Clayburn PertEvan

## 2017-11-08 NOTE — Telephone Encounter (Signed)
added

## 2017-11-10 ENCOUNTER — Ambulatory Visit (INDEPENDENT_AMBULATORY_CARE_PROVIDER_SITE_OTHER): Payer: Federal, State, Local not specified - PPO

## 2017-11-10 DIAGNOSIS — Z1231 Encounter for screening mammogram for malignant neoplasm of breast: Secondary | ICD-10-CM

## 2017-11-10 DIAGNOSIS — Z1239 Encounter for other screening for malignant neoplasm of breast: Secondary | ICD-10-CM

## 2017-11-28 ENCOUNTER — Ambulatory Visit (INDEPENDENT_AMBULATORY_CARE_PROVIDER_SITE_OTHER): Payer: Federal, State, Local not specified - PPO | Admitting: Family Medicine

## 2017-11-28 VITALS — BP 120/73 | HR 67 | Temp 98.0°F

## 2017-11-28 DIAGNOSIS — Z23 Encounter for immunization: Secondary | ICD-10-CM

## 2017-11-28 NOTE — Progress Notes (Signed)
Kristine Mclaughlin presents to the clinic for a shingles injection.  Pt tolerated injection well in the left deltoid without complications. -EH/RMA

## 2017-11-30 ENCOUNTER — Encounter: Payer: Self-pay | Admitting: Osteopathic Medicine

## 2017-11-30 ENCOUNTER — Other Ambulatory Visit (HOSPITAL_COMMUNITY)
Admission: RE | Admit: 2017-11-30 | Discharge: 2017-11-30 | Disposition: A | Discharge status: Home / Self Care | Payer: Federal, State, Local not specified - PPO | Source: Ambulatory Visit | Attending: Osteopathic Medicine | Admitting: Osteopathic Medicine

## 2017-11-30 ENCOUNTER — Ambulatory Visit (INDEPENDENT_AMBULATORY_CARE_PROVIDER_SITE_OTHER): Payer: Federal, State, Local not specified - PPO | Admitting: Osteopathic Medicine

## 2017-11-30 VITALS — BP 110/66 | HR 58 | Temp 98.0°F | Wt 164.1 lb

## 2017-11-30 DIAGNOSIS — Z113 Encounter for screening for infections with a predominantly sexual mode of transmission: Secondary | ICD-10-CM

## 2017-11-30 DIAGNOSIS — Z124 Encounter for screening for malignant neoplasm of cervix: Secondary | ICD-10-CM

## 2017-12-01 ENCOUNTER — Encounter: Payer: Self-pay | Admitting: Osteopathic Medicine

## 2017-12-01 LAB — TRICHOMONAS VAGINALIS, PROBE AMP: TRICHOMONAS VAGINALIS RNA: NOT DETECTED

## 2017-12-01 LAB — C. TRACHOMATIS/N. GONORRHOEAE RNA
C. trachomatis RNA, TMA: NOT DETECTED
N. gonorrhoeae RNA, TMA: NOT DETECTED

## 2017-12-01 NOTE — Progress Notes (Signed)
HPI: Kristine Mclaughlin is a 62 y.o. female who  has a past medical history of Hyperlipidemia and Hypertension.  she presents to [facility] today, 12/01/17,  for chief complaint of:  Pap -followed by female colleague is primary in the office, patient prefers female provider do the female exam.     Past medical history, surgical history, and family history reviewed.  Current medication list and allergy/intolerance information reviewed.   (See remainder of HPI, ROS, Phys Exam below)   ASSESSMENT/PLAN:   Cervical cancer screening - Plan: Cytology - PAP  Routine screening for STI (sexually transmitted infection) - Plan: Trichomonas vaginalis, RNA, C. trachomatis/N. gonorrhoeae RNA     Follow-up plan: Return for Routine care as directed by PCP, Dr. Denyse Amass.                      ############################################ ############################################ ############################################ ############################################    Outpatient Encounter Medications as of 11/30/2017  Medication Sig  . amLODipine (NORVASC) 10 MG tablet Take 1 tablet (10 mg total) by mouth daily.  . benazepril (LOTENSIN) 20 MG tablet Take 1 tablet (20 mg total) by mouth daily.  Marland Kitchen ezetimibe (ZETIA) 10 MG tablet Take 1 tablet (10 mg total) by mouth daily. In addition to Crestor for cholesterol  . MEGARED OMEGA-3 KRILL OIL PO Take 600 mg by mouth.  . Multiple Vitamin (THERA) TABS Take 1 tablet by mouth.  . rosuvastatin (CRESTOR) 40 MG tablet Take 1 tablet (40 mg total) by mouth daily.  . Turmeric 450 MG CAPS Take by mouth.   No facility-administered encounter medications on file as of 11/30/2017.    No Known Allergies    Review of Systems:  Constitutional: No recent illness  Skin: No  Rash  GU: no concerning vaginal discharge or bleeding, no pelvic pain   Hem/Onc: No  easy bruising/bleeding, No  abnormal  lumps/bumps   Exam:  BP 110/66 (BP Location: Left Arm, Patient Position: Sitting, Cuff Size: Normal)   Pulse (!) 58   Temp 98 F (36.7 C) (Oral)   Wt 164 lb 1.6 oz (74.4 kg)   BMI 26.49 kg/m   Constitutional: VS see above. General Appearance: alert, well-developed, well-nourished, NAD  Eyes: Normal lids and conjunctive, non-icteric sclera  Ears, Nose, Mouth, Throat: MMM, Normal external inspection ears/nares/mouth/lips/gums.  Neck: No masses, trachea midline.   Respiratory: Normal respiratory effort.   Musculoskeletal: Gait normal. Symmetric and independent movement of all extremities  Neurological: Normal balance/coordination. No tremor.  Skin: warm, dry, intact.   Psychiatric: Normal judgment/insight. Normal mood and affect. Oriented x3.  GYN: No lesions/ulcers to external genitalia, normal urethra, normal vaginal mucosa, physiologic discharge, cervix normal without lesions, uterus not enlarged or tender, adnexa no masses and nontender  BREAST: No rashes/skin changes, normal fibrous breast tissue, no masses or tenderness, normal nipple without discharge, normal axilla   Visit summary with medication list and pertinent instructions was printed for patient to review, advised to alert [place] if any changes needed. All questions at time of visit were answered - patient instructed to contact office with any additional concerns. ER/RTC precautions were reviewed with the patient and understanding verbalized.   Follow-up plan: Return for Routine care as directed by PCP, Dr. Denyse Amass.  Note: Total time spent 15 minutes, greater than 50% of the visit was spent face-to-face counseling and coordinating care for the following: The primary encounter diagnosis was Cervical cancer screening. A diagnosis of Routine screening for STI (sexually transmitted  infection) was also pertinent to this visit.Marland Kitchen  Please note: voice recognition software was used to produce this document, and typos may escape  review. Please contact Dr. Lyn Hollingshead for any needed clarifications.

## 2017-12-02 LAB — CYTOLOGY - PAP
Diagnosis: NEGATIVE
HPV: NOT DETECTED

## 2018-03-22 ENCOUNTER — Ambulatory Visit (INDEPENDENT_AMBULATORY_CARE_PROVIDER_SITE_OTHER): Payer: Federal, State, Local not specified - PPO | Admitting: Family Medicine

## 2018-03-22 DIAGNOSIS — Z23 Encounter for immunization: Secondary | ICD-10-CM | POA: Diagnosis not present

## 2018-03-22 NOTE — Progress Notes (Signed)
Pt in today for shingrix vaccine. This is 2 of 2 of the shingrix series. Vitals taken and no fever noted. Vaccine was given in  Left deltoid. Pt tolerated well with no immediate complications. 

## 2018-06-23 ENCOUNTER — Other Ambulatory Visit: Payer: Self-pay | Admitting: Family Medicine

## 2018-06-23 DIAGNOSIS — E785 Hyperlipidemia, unspecified: Secondary | ICD-10-CM

## 2018-06-26 ENCOUNTER — Ambulatory Visit (INDEPENDENT_AMBULATORY_CARE_PROVIDER_SITE_OTHER): Payer: Federal, State, Local not specified - PPO | Admitting: Family Medicine

## 2018-06-26 ENCOUNTER — Encounter: Payer: Self-pay | Admitting: Family Medicine

## 2018-06-26 VITALS — BP 117/68 | HR 83 | Wt 158.0 lb

## 2018-06-26 DIAGNOSIS — Z6825 Body mass index (BMI) 25.0-25.9, adult: Secondary | ICD-10-CM | POA: Diagnosis not present

## 2018-06-26 DIAGNOSIS — E785 Hyperlipidemia, unspecified: Secondary | ICD-10-CM

## 2018-06-26 DIAGNOSIS — I10 Essential (primary) hypertension: Secondary | ICD-10-CM

## 2018-06-26 MED ORDER — BUPROPION HCL ER (XL) 150 MG PO TB24: 150.0000 mg | ORAL_TABLET | Freq: Every day | ORAL | 2 refills | Status: DC

## 2018-06-26 MED ORDER — EZETIMIBE 10 MG PO TABS: 10.0000 mg | ORAL_TABLET | Freq: Every day | ORAL | 1 refills | Status: DC

## 2018-06-26 NOTE — Patient Instructions (Addendum)
Thank you for coming in today.  Continue cholesterol medication. Get labs in the near future. Start Wellbutrin (bupropion) for food cravings. Recheck via video visit in 1 month for discussion of benefit.  Set of food goal for about 1400 cal/day.  Use food logging application like my fitness pal.  Bupropion extended-release tablets (Depression/Mood Disorders) What is this medicine? BUPROPION (byoo PROE pee on) is used to treat depression. This medicine may be used for other purposes; ask your health care provider or pharmacist if you have questions. COMMON BRAND NAME(S): Aplenzin, Budeprion XL, Forfivo XL, Wellbutrin XL What should I tell my health care provider before I take this medicine? They need to know if you have any of these conditions: -an eating disorder, such as anorexia or bulimia -bipolar disorder or psychosis -diabetes or high blood sugar, treated with medication -glaucoma -head injury or brain tumor -heart disease, previous heart attack, or irregular heart beat -high blood pressure -kidney or liver disease -seizures (convulsions) -suicidal thoughts or a previous suicide attempt -Tourette's syndrome -weight loss -an unusual or allergic reaction to bupropion, other medicines, foods, dyes, or preservatives -breast-feeding -pregnant or trying to become pregnant How should I use this medicine? Take this medicine by mouth with a glass of water. Follow the directions on the prescription label. You can take it with or without food. If it upsets your stomach, take it with food. Do not crush, chew, or cut these tablets. This medicine is taken once daily at the same time each day. Do not take your medicine more often than directed. Do not stop taking this medicine suddenly except upon the advice of your doctor. Stopping this medicine too quickly may cause serious side effects or your condition may worsen. A special MedGuide will be given to you by the pharmacist with each  prescription and refill. Be sure to read this information carefully each time. Talk to your pediatrician regarding the use of this medicine in children. Special care may be needed. Overdosage: If you think you have taken too much of this medicine contact a poison control center or emergency room at once. NOTE: This medicine is only for you. Do not share this medicine with others. What if I miss a dose? If you miss a dose, skip the missed dose and take your next tablet at the regular time. Do not take double or extra doses. What may interact with this medicine? Do not take this medicine with any of the following medications: -linezolid -MAOIs like Azilect, Carbex, Eldepryl, Marplan, Nardil, and Parnate -methylene blue (injected into a vein) -other medicines that contain bupropion like Zyban This medicine may also interact with the following medications: -alcohol -certain medicines for anxiety or sleep -certain medicines for blood pressure like metoprolol, propranolol -certain medicines for depression or psychotic disturbances -certain medicines for HIV or AIDS like efavirenz, lopinavir, nelfinavir, ritonavir -certain medicines for irregular heart beat like propafenone, flecainide -certain medicines for Parkinson's disease like amantadine, levodopa -certain medicines for seizures like carbamazepine, phenytoin, phenobarbital -cimetidine -clopidogrel -cyclophosphamide -digoxin -furazolidone -isoniazid -nicotine -orphenadrine -procarbazine -steroid medicines like prednisone or cortisone -stimulant medicines for attention disorders, weight loss, or to stay awake -tamoxifen -theophylline -thiotepa -ticlopidine -tramadol -warfarin This list may not describe all possible interactions. Give your health care provider a list of all the medicines, herbs, non-prescription drugs, or dietary supplements you use. Also tell them if you smoke, drink alcohol, or use illegal drugs. Some items may  interact with your medicine. What should I watch for while using  this medicine? Tell your doctor if your symptoms do not get better or if they get worse. Visit your doctor or health care professional for regular checks on your progress. Because it may take several weeks to see the full effects of this medicine, it is important to continue your treatment as prescribed by your doctor. Patients and their families should watch out for new or worsening thoughts of suicide or depression. Also watch out for sudden changes in feelings such as feeling anxious, agitated, panicky, irritable, hostile, aggressive, impulsive, severely restless, overly excited and hyperactive, or not being able to sleep. If this happens, especially at the beginning of treatment or after a change in dose, call your health care professional. Avoid alcoholic drinks while taking this medicine. Drinking large amounts of alcoholic beverages, using sleeping or anxiety medicines, or quickly stopping the use of these agents while taking this medicine may increase your risk for a seizure. Do not drive or use heavy machinery until you know how this medicine affects you. This medicine can impair your ability to perform these tasks. Do not take this medicine close to bedtime. It may prevent you from sleeping. Your mouth may get dry. Chewing sugarless gum or sucking hard candy, and drinking plenty of water may help. Contact your doctor if the problem does not go away or is severe. The tablet shell for some brands of this medicine does not dissolve. This is normal. The tablet shell may appear whole in the stool. This is not a cause for concern. What side effects may I notice from receiving this medicine? Side effects that you should report to your doctor or health care professional as soon as possible: -allergic reactions like skin rash, itching or hives, swelling of the face, lips, or tongue -breathing problems -changes in  vision -confusion -elevated mood, decreased need for sleep, racing thoughts, impulsive behavior -fast or irregular heartbeat -hallucinations, loss of contact with reality -increased blood pressure -redness, blistering, peeling or loosening of the skin, including inside the mouth -seizures -suicidal thoughts or other mood changes -unusually weak or tired -vomiting Side effects that usually do not require medical attention (report to your doctor or health care professional if they continue or are bothersome): -constipation -headache -loss of appetite -nausea -tremors -weight loss This list may not describe all possible side effects. Call your doctor for medical advice about side effects. You may report side effects to FDA at 1-800-FDA-1088. Where should I keep my medicine? Keep out of the reach of children. Store at room temperature between 15 and 30 degrees C (59 and 86 degrees F). Throw away any unused medicine after the expiration date. NOTE: This sheet is a summary. It may not cover all possible information. If you have questions about this medicine, talk to your doctor, pharmacist, or health care provider.  2019 Elsevier/Gold Standard (2015-08-01 13:55:13)

## 2018-06-26 NOTE — Progress Notes (Signed)
Virtual Visit  via Video Note  I connected with      Kristine Mclaughlin by a video enabled telemedicine application and verified that I am speaking with the correct person using two identifiers.   I discussed the limitations of evaluation and management by telemedicine and the availability of in person appointments. The patient expressed understanding and agreed to proceed.  History of Present Illness: Kristine Mclaughlin is a 63 y.o. female who would like to discuss hyperlipidemia.Kristine Mclaughlin has a history of hyperlipidemia.  Previously she was reasonably managed with Crestor 40 however fasting labs were obtained and showed LDL elevated beyond goal of 100.  At that time Zetia was added.  She has been taking this regimen for about 6 months and tolerating it well with no issues.  She is not had repeat labs since.  Additionally she notes that she is struggling to lose weight.  She is maintained her current body weight for some time now but has had difficulty losing weight further.  She exercises every day and tries to eat a careful diet.  She does not do calorie logging but tries to get 40 g or fewer per day and eat plenty of vegetables.  Her weight goal is 140 pounds.  She notes bothersome cravings especially at bedtime that interfere with her ability to lose weight.  She is interested in medication options if possible.    Observations/Objective: BP 117/68   Pulse 83   Wt 158 lb (71.7 kg)   BMI 25.50 kg/m  Wt Readings from Last 5 Encounters:  06/26/18 158 lb (71.7 kg)  11/30/17 164 lb 1.6 oz (74.4 kg)  11/01/17 161 lb (73 kg)  06/28/17 157 lb 11.2 oz (71.5 kg)  08/17/16 153 lb (69.4 kg)   Exam: Appearance Normal Speech.    Assessment and Plan: 63 y.o. female with  Hyperlipidemia: Plan to check basic fasting labs including CBC lipid panel metabolic panel the near future to assess safety and efficacy of Crestor and Zetia.  Will adjust medications based on lab results.  Zetia refilled.  BMI  greater than 25.  Patient is overweight and has difficulty reducing weight her further.  We spent time talking about calorie counting calorie goals and exercise status.  Plan for 1400 to 1500 cal/day with continued exercise.  Recommend calorie log.  Discussed medication options.  It is reasonable to do a trial of patients that are unlikely to cause harm but may provide some mild benefit.  Will use bupropion as a first attempt and recheck in 1 month.  PDMP not reviewed this encounter. Orders Placed This Encounter  Procedures  . COMPLETE METABOLIC PANEL WITH GFR  . Lipid Panel w/reflex Direct LDL  . CBC   Meds ordered this encounter  Medications  . ezetimibe (ZETIA) 10 MG tablet    Sig: Take 1 tablet (10 mg total) by mouth daily.    Dispense:  90 tablet    Refill:  1  . buPROPion (WELLBUTRIN XL) 150 MG 24 hr tablet    Sig: Take 1 tablet (150 mg total) by mouth daily.    Dispense:  30 tablet    Refill:  2    Follow Up Instructions:    I discussed the assessment and treatment plan with the patient. The patient was provided an opportunity to ask questions and all were answered. The patient agreed with the plan and demonstrated an understanding of the instructions.   The patient was advised to call back  or seek an in-person evaluation if the symptoms worsen or if the condition fails to improve as anticipated.  Time: 25 minutes of intraservice time, with >39 minutes of total time during today's visit.      Historical information moved to improve visibility of documentation.  Past Medical History:  Diagnosis Date  . Hyperlipidemia   . Hypertension    Past Surgical History:  Procedure Laterality Date  . BREAST SURGERY  1992   lumpectomy--no CA   Social History   Tobacco Use  . Smoking status: Never Smoker  . Smokeless tobacco: Never Used  Substance Use Topics  . Alcohol use: Yes    Alcohol/week: 1.0 standard drinks    Types: 1 Cans of beer per week   family history  includes Coronary artery disease in her unknown relative; Diabetes in her brother and father; Heart disease (age of onset: 7056) in her brother; Heart disease (age of onset: 5162) in her father; Hyperlipidemia in her brother and father; Hypertension in her brother and father.  Medications: Current Outpatient Medications  Medication Sig Dispense Refill  . amLODipine (NORVASC) 10 MG tablet Take 1 tablet (10 mg total) by mouth daily. 90 tablet 3  . benazepril (LOTENSIN) 20 MG tablet Take 1 tablet (20 mg total) by mouth daily. 90 tablet 3  . ezetimibe (ZETIA) 10 MG tablet Take 1 tablet (10 mg total) by mouth daily. 90 tablet 1  . MEGARED OMEGA-3 KRILL OIL PO Take 600 mg by mouth.    . Multiple Vitamin (THERA) TABS Take 1 tablet by mouth.    . rosuvastatin (CRESTOR) 40 MG tablet Take 1 tablet (40 mg total) by mouth daily. 90 tablet 3  . Turmeric 450 MG CAPS Take by mouth.    Marland Kitchen. buPROPion (WELLBUTRIN XL) 150 MG 24 hr tablet Take 1 tablet (150 mg total) by mouth daily. 30 tablet 2   No current facility-administered medications for this visit.    No Known Allergies

## 2018-06-29 DIAGNOSIS — E785 Hyperlipidemia, unspecified: Secondary | ICD-10-CM | POA: Diagnosis not present

## 2018-06-29 DIAGNOSIS — I10 Essential (primary) hypertension: Secondary | ICD-10-CM | POA: Diagnosis not present

## 2018-06-30 LAB — LIPID PANEL W/REFLEX DIRECT LDL
Cholesterol: 232 mg/dL — ABNORMAL HIGH (ref <200)
HDL: 81 mg/dL (ref >=50)
LDL Cholesterol (Calc): 130 mg/dL (calc) — ABNORMAL HIGH
Non-HDL Cholesterol (Calc): 151 mg/dL (calc) — ABNORMAL HIGH (ref <130)
Total CHOL/HDL Ratio: 2.9 (calc) (ref <5.0)
Triglycerides: 99 mg/dL (ref <150)

## 2018-06-30 LAB — CBC
HCT: 40.7 % (ref 35.0–45.0)
Hemoglobin: 13.6 g/dL (ref 11.7–15.5)
MCH: 28.4 pg (ref 27.0–33.0)
MCHC: 33.4 g/dL (ref 32.0–36.0)
MCV: 85 fL (ref 80.0–100.0)
MPV: 12 fL (ref 7.5–12.5)
Platelets: 270 10*3/uL (ref 140–400)
RBC: 4.79 10*6/uL (ref 3.80–5.10)
RDW: 12.9 % (ref 11.0–15.0)
WBC: 6.4 10*3/uL (ref 3.8–10.8)

## 2018-06-30 LAB — COMPLETE METABOLIC PANEL WITH GFR
AG Ratio: 1.7 (calc) (ref 1.0–2.5)
ALT: 50 U/L — ABNORMAL HIGH (ref 6–29)
AST: 36 U/L — ABNORMAL HIGH (ref 10–35)
Albumin: 4.6 g/dL (ref 3.6–5.1)
Alkaline phosphatase (APISO): 60 U/L (ref 37–153)
BUN: 9 mg/dL (ref 7–25)
CO2: 29 mmol/L (ref 20–32)
Calcium: 9.8 mg/dL (ref 8.6–10.4)
Chloride: 104 mmol/L (ref 98–110)
Creat: 0.81 mg/dL (ref 0.50–0.99)
GFR, Est African American: 91 mL/min/{1.73_m2} (ref >=60)
GFR, Est Non African American: 78 mL/min/{1.73_m2} (ref >=60)
Globulin: 2.7 g/dL (calc) (ref 1.9–3.7)
Glucose, Bld: 96 mg/dL (ref 65–99)
Potassium: 4.2 mmol/L (ref 3.5–5.3)
Sodium: 140 mmol/L (ref 135–146)
Total Bilirubin: 0.7 mg/dL (ref 0.2–1.2)
Total Protein: 7.3 g/dL (ref 6.1–8.1)

## 2018-07-27 ENCOUNTER — Ambulatory Visit: Payer: Federal, State, Local not specified - PPO | Admitting: Family Medicine

## 2018-09-24 ENCOUNTER — Other Ambulatory Visit: Payer: Self-pay | Admitting: Family Medicine

## 2018-09-24 DIAGNOSIS — E785 Hyperlipidemia, unspecified: Secondary | ICD-10-CM

## 2018-10-24 ENCOUNTER — Other Ambulatory Visit: Payer: Self-pay | Admitting: Family Medicine

## 2018-10-24 DIAGNOSIS — I10 Essential (primary) hypertension: Secondary | ICD-10-CM

## 2018-10-31 ENCOUNTER — Other Ambulatory Visit: Payer: Self-pay

## 2018-10-31 MED ORDER — BUPROPION HCL ER (XL) 150 MG PO TB24: 150.0000 mg | ORAL_TABLET | Freq: Every day | ORAL | 2 refills | Status: DC

## 2018-11-20 ENCOUNTER — Other Ambulatory Visit: Payer: Self-pay | Admitting: Family Medicine

## 2018-11-20 DIAGNOSIS — Z1231 Encounter for screening mammogram for malignant neoplasm of breast: Secondary | ICD-10-CM

## 2018-12-13 ENCOUNTER — Other Ambulatory Visit: Payer: Self-pay

## 2018-12-13 ENCOUNTER — Ambulatory Visit (INDEPENDENT_AMBULATORY_CARE_PROVIDER_SITE_OTHER): Payer: Federal, State, Local not specified - PPO

## 2018-12-13 DIAGNOSIS — Z1231 Encounter for screening mammogram for malignant neoplasm of breast: Secondary | ICD-10-CM

## 2018-12-29 ENCOUNTER — Other Ambulatory Visit: Payer: Self-pay | Admitting: Family Medicine

## 2018-12-29 DIAGNOSIS — E785 Hyperlipidemia, unspecified: Secondary | ICD-10-CM

## 2019-03-29 ENCOUNTER — Other Ambulatory Visit: Payer: Self-pay | Admitting: Family Medicine

## 2019-03-29 DIAGNOSIS — E785 Hyperlipidemia, unspecified: Secondary | ICD-10-CM

## 2019-04-09 ENCOUNTER — Ambulatory Visit (INDEPENDENT_AMBULATORY_CARE_PROVIDER_SITE_OTHER): Payer: Federal, State, Local not specified - PPO | Admitting: Family Medicine

## 2019-04-09 ENCOUNTER — Encounter: Payer: Self-pay | Admitting: Family Medicine

## 2019-04-09 ENCOUNTER — Other Ambulatory Visit: Payer: Self-pay

## 2019-04-09 VITALS — BP 136/84 | HR 80 | Temp 98.1°F | Ht 66.0 in | Wt 173.0 lb

## 2019-04-09 DIAGNOSIS — E785 Hyperlipidemia, unspecified: Secondary | ICD-10-CM | POA: Diagnosis not present

## 2019-04-09 DIAGNOSIS — Z0000 Encounter for general adult medical examination without abnormal findings: Secondary | ICD-10-CM | POA: Diagnosis not present

## 2019-04-09 DIAGNOSIS — I10 Essential (primary) hypertension: Secondary | ICD-10-CM | POA: Diagnosis not present

## 2019-04-09 NOTE — Patient Instructions (Signed)
It was very nice to meet you today! Please continue current medications. We'll be in touch with lab results. See me again in 6 months   Preventive Care 63-63 Years Old, Female Preventive care refers to visits with your health care provider and lifestyle choices that can promote health and wellness. This includes:  A yearly physical exam. This may also be called an annual well check.  Regular dental visits and eye exams.  Immunizations.  Screening for certain conditions.  Healthy lifestyle choices, such as eating a healthy diet, getting regular exercise, not using drugs or products that contain nicotine and tobacco, and limiting alcohol use. What can I expect for my preventive care visit? Physical exam Your health care provider will check your:  Height and weight. This may be used to calculate body mass index (BMI), which tells if you are at a healthy weight.  Heart rate and blood pressure.  Skin for abnormal spots. Counseling Your health care provider may ask you questions about your:  Alcohol, tobacco, and drug use.  Emotional well-being.  Home and relationship well-being.  Sexual activity.  Eating habits.  Work and work Statistician.  Method of birth control.  Menstrual cycle.  Pregnancy history. What immunizations do I need?  Influenza (flu) vaccine  This is recommended every year. Tetanus, diphtheria, and pertussis (Tdap) vaccine  You may need a Td booster every 10 years. Varicella (chickenpox) vaccine  You may need this if you have not been vaccinated. Zoster (shingles) vaccine  You may need this after age 46. Measles, mumps, and rubella (MMR) vaccine  You may need at least one dose of MMR if you were born in 1957 or later. You may also need a second dose. Pneumococcal conjugate (PCV13) vaccine  You may need this if you have certain conditions and were not previously vaccinated. Pneumococcal polysaccharide (PPSV23) vaccine  You may need one or  two doses if you smoke cigarettes or if you have certain conditions. Meningococcal conjugate (MenACWY) vaccine  You may need this if you have certain conditions. Hepatitis A vaccine  You may need this if you have certain conditions or if you travel or work in places where you may be exposed to hepatitis A. Hepatitis B vaccine  You may need this if you have certain conditions or if you travel or work in places where you may be exposed to hepatitis B. Haemophilus influenzae type b (Hib) vaccine  You may need this if you have certain conditions. Human papillomavirus (HPV) vaccine  If recommended by your health care provider, you may need three doses over 6 months. You may receive vaccines as individual doses or as more than one vaccine together in one shot (combination vaccines). Talk with your health care provider about the risks and benefits of combination vaccines. What tests do I need? Blood tests  Lipid and cholesterol levels. These may be checked every 5 years, or more frequently if you are over 63 years old.  Hepatitis C test.  Hepatitis B test. Screening  Lung cancer screening. You may have this screening every year starting at age 63 if you have a 30-pack-year history of smoking and currently smoke or have quit within the past 15 years.  Colorectal cancer screening. All adults should have this screening starting at age 42 and continuing until age 63. Your health care provider may recommend screening at age 12 if you are at increased risk. You will have tests every 1-10 years, depending on your results and the type of  screening test.  Diabetes screening. This is done by checking your blood sugar (glucose) after you have not eaten for a while (fasting). You may have this done every 1-3 years.  Mammogram. This may be done every 1-2 years. Talk with your health care provider about when you should start having regular mammograms. This may depend on whether you have a family history  of breast cancer.  BRCA-related cancer screening. This may be done if you have a family history of breast, ovarian, tubal, or peritoneal cancers.  Pelvic exam and Pap test. This may be done every 3 years starting at age 21. Starting at age 30, this may be done every 5 years if you have a Pap test in combination with an HPV test. Other tests  Sexually transmitted disease (STD) testing.  Bone density scan. This is done to screen for osteoporosis. You may have this scan if you are at high risk for osteoporosis. Follow these instructions at home: Eating and drinking  Eat a diet that includes fresh fruits and vegetables, whole grains, lean protein, and low-fat dairy.  Take vitamin and mineral supplements as recommended by your health care provider.  Do not drink alcohol if: ? Your health care provider tells you not to drink. ? You are pregnant, may be pregnant, or are planning to become pregnant.  If you drink alcohol: ? Limit how much you have to 0-1 drink a day. ? Be aware of how much alcohol is in your drink. In the [place] one drink equals one 12 oz bottle of beer (355 mL), one 5 oz glass of wine (148 mL), or one 1 oz glass of hard liquor (44 mL). Lifestyle  Take daily care of your teeth and gums.  Stay active. Exercise for at least 30 minutes on 5 or more days each week.  Do not use any products that contain nicotine or tobacco, such as cigarettes, e-cigarettes, and chewing tobacco. If you need help quitting, ask your health care provider.  If you are sexually active, practice safe sex. Use a condom or other form of birth control (contraception) in order to prevent pregnancy and STIs (sexually transmitted infections).  If told by your health care provider, take low-dose aspirin daily starting at age 50. What's next?  Visit your health care provider once a year for a well check visit.  Ask your health care provider how often you should have your eyes and teeth checked.  Stay up  to date on all vaccines. This information is not intended to replace advice given to you by your health care provider. Make sure you discuss any questions you have with your health care provider. Document Revised: 10/20/2017 Document Reviewed: 10/20/2017 Elsevier Patient Education  2020 [employer]  

## 2019-04-09 NOTE — Progress Notes (Signed)
Kristine Mclaughlin - 64 y.o. female MRN [id number]  Date of birth: [birth year]  Subjective Chief Complaint  Patient presents with  . Annual Exam    HPI Kristine Mclaughlin is a 64 y.o. female with history of HTN, HLD and depression here today for annual exam.  She states she is doing well today.  She has no new concerns. She is compliant with medications for chronic medical conditions and reports that she is tolerating well.    She works for [employer] some mild increased stress but managing well.   She stays fairly active and follows a healthy diet.   She is up to date on mammogram and pap, repeat in 5 years.   Last colonoscopy was in 2018, repeat in 10 years.   She is a non-smoker and consumes 1 can of beer/week.   Review of Systems  Constitutional: Negative for chills, fever, malaise/fatigue and weight loss.  HENT: Negative for congestion, ear pain and sore throat.   Eyes: Negative for blurred vision, double vision and pain.  Respiratory: Negative for cough and shortness of breath.   Cardiovascular: Negative for chest pain and palpitations.  Gastrointestinal: Negative for abdominal pain, blood in stool, constipation, heartburn and nausea.  Genitourinary: Negative for dysuria and urgency.  Musculoskeletal: Negative for joint pain and myalgias.  Neurological: Negative for dizziness and headaches.  Endo/Heme/Allergies: Does not bruise/bleed easily.  Psychiatric/Behavioral: Negative for depression. The patient is not nervous/anxious and does not have insomnia.     No Known Allergies  Past Medical History:  Diagnosis Date  . Hyperlipidemia   . Hypertension     Past Surgical History:  Procedure Laterality Date  . BREAST BIOPSY    . BREAST EXCISIONAL BIOPSY    . BREAST SURGERY  1992   lumpectomy--no CA    Social History   Socioeconomic History  . Marital status: Divorced    Spouse name: Not on file  . Number of children: Not on file  . Years of education: Not on file  . Highest  education level: Not on file  Occupational History    Employer: [place] [employer]  Tobacco Use  . Smoking status: Never Smoker  . Smokeless tobacco: Never Used  Substance and Sexual Activity  . Alcohol use: Yes    Alcohol/week: 1.0 standard drinks    Types: 1 Cans of beer per week  . Drug use: No  . Sexual activity: Yes    Partners: Male  Other Topics Concern  . Not on file  Social History Narrative   Exercise-- walks daily 3 miles   Social Determinants of Health   Financial Resource Strain:   . Difficulty of Paying Living Expenses: Not on file  Food Insecurity:   . Worried About Charity fundraiser in the Last Year: Not on file  . Ran Out of Food in the Last Year: Not on file  Transportation Needs:   . Lack of Transportation (Medical): Not on file  . Lack of Transportation (Non-Medical): Not on file  Physical Activity:   . Days of Exercise per Week: Not on file  . Minutes of Exercise per Session: Not on file  Stress:   . Feeling of Stress : Not on file  Social Connections:   . Frequency of Communication with Friends and Family: Not on file  . Frequency of Social Gatherings with Friends and Family: Not on file  . Attends Religious Services: Not on file  . Active Member of Clubs or Organizations: Not  on file  . Attends Banker Meetings: Not on file  . Marital Status: Not on file    Family History  Problem Relation Age of Onset  . Diabetes Father   . Hyperlipidemia Father   . Hypertension Father   . Heart disease Father 67       1st MI--- triple bypass  . Diabetes Brother   . Heart disease Brother 56       quadruple bypass  . Hyperlipidemia Brother   . Hypertension Brother   . Coronary artery disease Other     Health Maintenance  Topic Date Due  . PAP SMEAR-Modifier  11/30/2020  . MAMMOGRAM  12/12/2020  . TETANUS/TDAP  10/30/2024  . COLONOSCOPY  12/21/2026  . INFLUENZA VACCINE  Completed  . Hepatitis C Screening  Completed  . HIV Screening   Completed    ----------------------------------------------------------------------------------------------------------------------------------------------------------------------------------------------------------------- Physical Exam BP 136/84   Pulse 80   Temp 98.1 F (36.7 C) (Oral)   Ht 5\' 6"  (1.676 m)   Wt 173 lb (78.5 kg)   BMI 27.92 kg/m   Physical Exam Constitutional:      General: She is not in acute distress.    Appearance: Normal appearance.  HENT:     Head: Normocephalic and atraumatic.     Right Ear: Tympanic membrane normal.     Left Ear: Tympanic membrane normal.     Nose: Nose normal.     Mouth/Throat:     Mouth: Mucous membranes are moist.  Eyes:     General: No scleral icterus.    Conjunctiva/sclera: Conjunctivae normal.  Neck:     Thyroid: No thyromegaly.  Cardiovascular:     Rate and Rhythm: Normal rate and regular rhythm.     Heart sounds: Normal heart sounds.  Pulmonary:     Effort: Pulmonary effort is normal.     Breath sounds: Normal breath sounds.  Abdominal:     General: Bowel sounds are normal. There is no distension.     Palpations: Abdomen is soft.     Tenderness: There is no abdominal tenderness. There is no guarding.  Musculoskeletal:        General: Normal range of motion.     Cervical back: Normal range of motion and neck supple.  Lymphadenopathy:     Cervical: No cervical adenopathy.  Skin:    General: Skin is warm and dry.     Findings: No rash.  Neurological:     General: No focal deficit present.     Mental Status: She is alert and oriented to person, place, and time.     Cranial Nerves: No cranial nerve deficit.     Coordination: Coordination normal.  Psychiatric:        Mood and Affect: Mood normal.        Behavior: Behavior normal.      ------------------------------------------------------------------------------------------------------------------------------------------------------------------------------------------------------------------- Assessment and Plan  Essential hypertension Blood pressure is at goal at for age and co-morbidities.  I recommend she continue current medications.  In addition they were instructed to follow a low sodium diet with regular exercise to help to maintain adequate control of blood pressure.    Dyslipidemia She is tolerating crestor and zetia.  Update lipid profile and LFT's  Well adult exam Well adult.  Orders Placed This Encounter  Procedures  . COMPLETE METABOLIC PANEL WITH GFR  . CBC  . TSH  . Lipid Profile  Screening: UTD Immunizations:  UTD Anticipatory guidance/Risk factor reduction:  Continue regular screenings for  breast and cervical cancer as scheduled.  Discussed diet recommendations of healthy, reduced fat diet rich in fruits and vegetables.      This visit occurred during the SARS-CoV-2 public health emergency.  Safety protocols were in place, including screening questions prior to the visit, additional usage of staff PPE, and extensive cleaning of exam room while observing appropriate contact time as indicated for disinfecting solutions.

## 2019-04-09 NOTE — Assessment & Plan Note (Signed)
She is tolerating crestor and zetia.  Update lipid profile and LFT's

## 2019-04-09 NOTE — Assessment & Plan Note (Signed)
Blood pressure is at goal at for age and co-morbidities.  I recommend she continue current medications.  In addition they were instructed to follow a low sodium diet with regular exercise to help to maintain adequate control of blood pressure.   

## 2019-04-09 NOTE — Assessment & Plan Note (Signed)
Well adult.  Orders Placed This Encounter  Procedures  . COMPLETE METABOLIC PANEL WITH GFR  . CBC  . TSH  . Lipid Profile  Screening: UTD Immunizations:  UTD Anticipatory guidance/Risk factor reduction:  Continue regular screenings for breast and cervical cancer as scheduled.  Discussed diet recommendations of healthy, reduced fat diet rich in fruits and vegetables.

## 2019-04-12 LAB — T4, FREE: Free T4: 1.1 ng/dL (ref 0.8–1.8)

## 2019-04-12 LAB — COMPLETE METABOLIC PANEL WITH GFR
AG Ratio: 1.7 (calc) (ref 1.0–2.5)
ALT: 62 U/L — ABNORMAL HIGH (ref 6–29)
AST: 47 U/L — ABNORMAL HIGH (ref 10–35)
Albumin: 4.6 g/dL (ref 3.6–5.1)
Alkaline phosphatase (APISO): 68 U/L (ref 37–153)
BUN: 13 mg/dL (ref 7–25)
CO2: 27 mmol/L (ref 20–32)
Calcium: 9.6 mg/dL (ref 8.6–10.4)
Chloride: 103 mmol/L (ref 98–110)
Creat: 0.9 mg/dL (ref 0.50–0.99)
GFR, Est African American: 79 mL/min/{1.73_m2} (ref >=60)
GFR, Est Non African American: 69 mL/min/{1.73_m2} (ref >=60)
Globulin: 2.7 g/dL (calc) (ref 1.9–3.7)
Glucose, Bld: 93 mg/dL (ref 65–99)
Potassium: 4.3 mmol/L (ref 3.5–5.3)
Sodium: 139 mmol/L (ref 135–146)
Total Bilirubin: 0.7 mg/dL (ref 0.2–1.2)
Total Protein: 7.3 g/dL (ref 6.1–8.1)

## 2019-04-12 LAB — CBC
HCT: 40.5 % (ref 35.0–45.0)
Hemoglobin: 13.2 g/dL (ref 11.7–15.5)
MCH: 27.9 pg (ref 27.0–33.0)
MCHC: 32.6 g/dL (ref 32.0–36.0)
MCV: 85.6 fL (ref 80.0–100.0)
MPV: 11.4 fL (ref 7.5–12.5)
Platelets: 267 10*3/uL (ref 140–400)
RBC: 4.73 10*6/uL (ref 3.80–5.10)
RDW: 12.6 % (ref 11.0–15.0)
WBC: 7.9 10*3/uL (ref 3.8–10.8)

## 2019-04-12 LAB — LIPID PANEL
Cholesterol: 208 mg/dL — ABNORMAL HIGH (ref <200)
HDL: 68 mg/dL (ref >=50)
LDL Cholesterol (Calc): 116 mg/dL (calc) — ABNORMAL HIGH
Non-HDL Cholesterol (Calc): 140 mg/dL (calc) — ABNORMAL HIGH (ref <130)
Total CHOL/HDL Ratio: 3.1 (calc) (ref <5.0)
Triglycerides: 128 mg/dL (ref <150)

## 2019-04-12 LAB — TEST AUTHORIZATION

## 2019-04-12 LAB — TSH: TSH: 0.32 mIU/L — ABNORMAL LOW (ref 0.40–4.50)

## 2019-05-30 ENCOUNTER — Telehealth: Payer: Federal, State, Local not specified - PPO | Admitting: Nurse Practitioner

## 2019-05-30 DIAGNOSIS — T7849XA Other allergy, initial encounter: Secondary | ICD-10-CM | POA: Diagnosis not present

## 2019-05-30 MED ORDER — PREDNISONE 20 MG PO TABS: ORAL_TABLET | ORAL | 0 refills | Status: DC

## 2019-05-30 NOTE — Progress Notes (Signed)
E Visit for Rash  We are sorry that you are not feeling well. Here is how we plan to help!  Based on what you shared with me it looks like you have contact dermatitis.  Contact dermatitis is a skin rash caused by something that touches the skin and causes irritation or inflammation.  Your skin may be red, swollen, dry, cracked, and itch.  The rash should go away in a few days but can last a few weeks.  If you get a rash, it's important to figure out what caused it so the irritant can be avoided in the future. and I have prescribed Prednisone 20 mg 2 dailyat same timedaily for 5 days  HOME CARE:   Take cool showers and avoid direct sunlight.  Apply cool compress or wet dressings.  Take a bath in an oatmeal bath.  Sprinkle content of one Aveeno packet under running faucet with comfortably warm water.  Bathe for 15-20 minutes, 1-2 times daily.  Pat dry with a towel. Do not rub the rash.  Use hydrocortisone cream.  Take an antihistamine like Benadryl for widespread rashes that itch.  The adult dose of Benadryl is 25-50 mg by mouth 4 times daily.  Caution:  This type of medication may cause sleepiness.  Do not drink alcohol, drive, or operate dangerous machinery while taking antihistamines.  Do not take these medications if you have prostate enlargement.  Read package instructions thoroughly on all medications that you take.  GET HELP RIGHT AWAY IF:   Symptoms don't go away after treatment.  Severe itching that persists.  If you rash spreads or swells.  If you rash begins to smell.  If it blisters and opens or develops a yellow-brown crust.  You develop a fever.  You have a sore throat.  You become short of breath.  MAKE SURE YOU:  Understand these instructions. Will watch your condition. Will get help right away if you are not doing well or get worse.  Thank you for choosing an e-visit. Your e-visit answers were reviewed by a board certified advanced clinical practitioner  to complete your personal care plan. Depending upon the condition, your plan could have included both over the counter or prescription medications. Please review your pharmacy choice. Be sure that the pharmacy you have chosen is open so that you can pick up your prescription now.  If there is a problem you may message your provider in MyChart to have the prescription routed to another pharmacy. Your safety is important to [place]. If you have drug allergies check your prescription carefully.  For the next 24 hours, you can use MyChart to ask questions about today's visit, request a non-urgent call back, or ask for a work or school excuse from your e-visit provider. You will get an email in the next two days asking about your experience. I hope that your e-visit has been valuable and will speed your recovery.   5-10 minutes spent reviewing and documenting in chart.

## 2019-07-02 ENCOUNTER — Other Ambulatory Visit: Payer: Self-pay

## 2019-07-02 MED ORDER — BUPROPION HCL ER (XL) 150 MG PO TB24: 150.0000 mg | ORAL_TABLET | Freq: Every day | ORAL | 2 refills | Status: DC

## 2019-07-03 ENCOUNTER — Other Ambulatory Visit: Payer: Self-pay

## 2019-07-03 DIAGNOSIS — I10 Essential (primary) hypertension: Secondary | ICD-10-CM

## 2019-07-03 DIAGNOSIS — E785 Hyperlipidemia, unspecified: Secondary | ICD-10-CM

## 2019-07-03 MED ORDER — BENAZEPRIL HCL 20 MG PO TABS: 20.0000 mg | ORAL_TABLET | Freq: Every day | ORAL | 3 refills | Status: DC

## 2019-07-03 MED ORDER — AMLODIPINE BESYLATE 10 MG PO TABS: 10.0000 mg | ORAL_TABLET | Freq: Every day | ORAL | 3 refills | Status: DC

## 2019-07-03 MED ORDER — EZETIMIBE 10 MG PO TABS: 10.0000 mg | ORAL_TABLET | Freq: Every day | ORAL | 3 refills | Status: DC

## 2019-09-17 ENCOUNTER — Other Ambulatory Visit: Payer: Self-pay

## 2019-09-17 ENCOUNTER — Ambulatory Visit: Payer: Federal, State, Local not specified - PPO | Admitting: Family Medicine

## 2019-09-17 ENCOUNTER — Encounter: Payer: Self-pay | Admitting: Family Medicine

## 2019-09-17 VITALS — BP 128/70 | HR 64 | Temp 97.2°F | Ht 66.14 in | Wt 164.6 lb

## 2019-09-17 DIAGNOSIS — R7989 Other specified abnormal findings of blood chemistry: Secondary | ICD-10-CM

## 2019-09-17 DIAGNOSIS — I10 Essential (primary) hypertension: Secondary | ICD-10-CM

## 2019-09-17 LAB — HEPATIC FUNCTION PANEL
AG Ratio: 1.8 (calc) (ref 1.0–2.5)
ALT: 39 U/L — ABNORMAL HIGH (ref 6–29)
AST: 31 U/L (ref 10–35)
Albumin: 4.6 g/dL (ref 3.6–5.1)
Alkaline phosphatase (APISO): 61 U/L (ref 37–153)
Bilirubin, Direct: 0.1 mg/dL (ref 0.0–0.2)
Globulin: 2.5 g/dL (calc) (ref 1.9–3.7)
Indirect Bilirubin: 0.5 mg/dL (calc) (ref 0.2–1.2)
Total Bilirubin: 0.6 mg/dL (ref 0.2–1.2)
Total Protein: 7.1 g/dL (ref 6.1–8.1)

## 2019-09-17 NOTE — Assessment & Plan Note (Signed)
Blood pressure is at goal at for age and co-morbidities.  I recommend she continue current medications.  In addition they were instructed to follow a low sodium diet with regular exercise to help to maintain adequate control of blood pressure.   

## 2019-09-17 NOTE — Assessment & Plan Note (Signed)
Working on weight loss and diet modification.  Recheck LFT's today.

## 2019-09-17 NOTE — Patient Instructions (Signed)
Great to see you today! Keep up the hard work and continued weight loss. Have labs drawn today, we'll be in touch with results.

## 2019-09-17 NOTE — Progress Notes (Signed)
Kristine Mclaughlin - 64 y.o. female MRN [id number]  Date of birth: 15-Mar-1955  Subjective Chief Complaint  Patient presents with  . Hypertension    HPI Kristine Mclaughlin is a 64 y.o. female with history of HTN, HLD, and elevated LFT's here today for follow up visit.   HTN currently managed with benazepril and amlodipine.  She is tolerating this well.  She has been increasing exercise and working on dietary changes.  Her weight is down about 10 lbs since last visit.     Her LFT's were elevated previously as well, likely form NAFLD.   She is taking crestor.  She is hopeful that changes to her diet  Including reduction in carbohydrates and increased exercise have improved this as well.    ROS:  A comprehensive ROS was completed and negative except as noted per HPI  No Known Allergies  Past Medical History:  Diagnosis Date  . Hyperlipidemia   . Hypertension     Past Surgical History:  Procedure Laterality Date  . BREAST BIOPSY    . BREAST EXCISIONAL BIOPSY    . BREAST SURGERY  1992   lumpectomy--no CA    Social History   Socioeconomic History  . Marital status: Divorced    Spouse name: Not on file  . Number of children: Not on file  . Years of education: Not on file  . Highest education level: Not on file  Occupational History    Employer: [place] [employer]  Tobacco Use  . Smoking status: Never Smoker  . Smokeless tobacco: Never Used  Substance and Sexual Activity  . Alcohol use: Yes    Alcohol/week: 1.0 standard drink    Types: 1 Cans of beer per week  . Drug use: No  . Sexual activity: Yes    Partners: Male  Other Topics Concern  . Not on file  Social History Narrative   Exercise-- walks daily 3 miles   Social Determinants of Health   Financial Resource Strain:   . Difficulty of Paying Living Expenses:   Food Insecurity:   . Worried About Programme researcher, broadcasting/film/video in the Last Year:   . Barista in the Last Year:   Transportation Needs:   . Freight forwarder  (Medical):   Marland Kitchen Lack of Transportation (Non-Medical):   Physical Activity:   . Days of Exercise per Week:   . Minutes of Exercise per Session:   Stress:   . Feeling of Stress :   Social Connections:   . Frequency of Communication with Friends and Family:   . Frequency of Social Gatherings with Friends and Family:   . Attends Religious Services:   . Active Member of Clubs or Organizations:   . Attends Banker Meetings:   Marland Kitchen Marital Status:     Family History  Problem Relation Age of Onset  . Diabetes Father   . Hyperlipidemia Father   . Hypertension Father   . Heart disease Father 16       1st MI--- triple bypass  . Diabetes Brother   . Heart disease Brother 56       quadruple bypass  . Hyperlipidemia Brother   . Hypertension Brother   . Coronary artery disease Other     Health Maintenance  Topic Date Due  . COVID-19 Vaccine (1) Never done  . INFLUENZA VACCINE  09/23/2019  . PAP SMEAR-Modifier  11/30/2020  . MAMMOGRAM  12/12/2020  . TETANUS/TDAP  10/30/2024  .  COLONOSCOPY  12/21/2026  . Hepatitis C Screening  Completed  . HIV Screening  Completed     ----------------------------------------------------------------------------------------------------------------------------------------------------------------------------------------------------------------- Physical Exam BP 128/70 (BP Location: Left Arm, Patient Position: Sitting, Cuff Size: Normal)   Pulse 64   Temp (!) 97.2 F (36.2 C) (Temporal)   Ht 5' 6.14" (1.68 m)   Wt 164 lb 9.6 oz (74.7 kg)   SpO2 99%   BMI 26.45 kg/m   Physical Exam Skin:    General: Skin is warm and dry.  Neurological:     General: No focal deficit present.     Mental Status: She is alert.  Psychiatric:        Mood and Affect: Mood normal.        Behavior: Behavior normal.      ------------------------------------------------------------------------------------------------------------------------------------------------------------------------------------------------------------------- Assessment and Plan  Essential hypertension Blood pressure is at goal at for age and co-morbidities.  I recommend she continue current medications.  In addition they were instructed to follow a low sodium diet with regular exercise to help to maintain adequate control of blood pressure.    Elevated liver function tests Working on weight loss and diet modification.  Recheck LFT's today.    No orders of the defined types were placed in this encounter.   Return in about 6 months (around 03/19/2020) for HTN.    This visit occurred during the SARS-CoV-2 public health emergency.  Safety protocols were in place, including screening questions prior to the visit, additional usage of staff PPE, and extensive cleaning of exam room while observing appropriate contact time as indicated for disinfecting solutions.

## 2019-10-04 ENCOUNTER — Encounter: Payer: Self-pay | Admitting: Family Medicine

## 2019-10-04 ENCOUNTER — Other Ambulatory Visit: Payer: Self-pay | Admitting: Family Medicine

## 2019-10-04 DIAGNOSIS — E785 Hyperlipidemia, unspecified: Secondary | ICD-10-CM

## 2020-01-01 ENCOUNTER — Other Ambulatory Visit: Payer: Self-pay | Admitting: Family Medicine

## 2020-01-01 DIAGNOSIS — Z1231 Encounter for screening mammogram for malignant neoplasm of breast: Secondary | ICD-10-CM

## 2020-02-28 ENCOUNTER — Other Ambulatory Visit: Payer: Self-pay

## 2020-02-28 ENCOUNTER — Ambulatory Visit (INDEPENDENT_AMBULATORY_CARE_PROVIDER_SITE_OTHER): Payer: Federal, State, Local not specified - PPO

## 2020-02-28 DIAGNOSIS — Z1231 Encounter for screening mammogram for malignant neoplasm of breast: Secondary | ICD-10-CM | POA: Diagnosis not present

## 2020-03-19 ENCOUNTER — Ambulatory Visit: Payer: Federal, State, Local not specified - PPO | Admitting: Family Medicine

## 2020-03-27 ENCOUNTER — Other Ambulatory Visit: Payer: Self-pay | Admitting: Family Medicine

## 2020-03-27 DIAGNOSIS — E785 Hyperlipidemia, unspecified: Secondary | ICD-10-CM

## 2020-04-07 ENCOUNTER — Encounter: Payer: Self-pay | Admitting: Family Medicine

## 2020-04-07 ENCOUNTER — Ambulatory Visit: Payer: Federal, State, Local not specified - PPO | Admitting: Family Medicine

## 2020-04-07 ENCOUNTER — Ambulatory Visit (INDEPENDENT_AMBULATORY_CARE_PROVIDER_SITE_OTHER): Payer: Federal, State, Local not specified - PPO | Admitting: Family Medicine

## 2020-04-07 ENCOUNTER — Other Ambulatory Visit: Payer: Self-pay

## 2020-04-07 VITALS — BP 129/71 | HR 61 | Temp 97.1°F | Wt 164.0 lb

## 2020-04-07 DIAGNOSIS — R1314 Dysphagia, pharyngoesophageal phase: Secondary | ICD-10-CM | POA: Diagnosis not present

## 2020-04-07 DIAGNOSIS — E041 Nontoxic single thyroid nodule: Secondary | ICD-10-CM | POA: Diagnosis not present

## 2020-04-07 DIAGNOSIS — E785 Hyperlipidemia, unspecified: Secondary | ICD-10-CM

## 2020-04-07 DIAGNOSIS — I10 Essential (primary) hypertension: Secondary | ICD-10-CM

## 2020-04-07 MED ORDER — AMLODIPINE BESYLATE 10 MG PO TABS: 10.0000 mg | ORAL_TABLET | Freq: Every day | ORAL | 3 refills | Status: DC

## 2020-04-07 MED ORDER — ROSUVASTATIN CALCIUM 40 MG PO TABS: ORAL_TABLET | ORAL | 3 refills | Status: DC

## 2020-04-07 MED ORDER — BENAZEPRIL HCL 20 MG PO TABS: 20.0000 mg | ORAL_TABLET | Freq: Every day | ORAL | 3 refills | Status: DC

## 2020-04-07 NOTE — Patient Instructions (Signed)
Great to see you! Continue current medications. Referral entered to GI specialist, you should get a call for appt. .  See me again in 6 months.

## 2020-04-08 LAB — COMPLETE METABOLIC PANEL WITH GFR
AG Ratio: 1.6 (calc) (ref 1.0–2.5)
ALT: 41 U/L — ABNORMAL HIGH (ref 6–29)
AST: 36 U/L — ABNORMAL HIGH (ref 10–35)
Albumin: 4.7 g/dL (ref 3.6–5.1)
Alkaline phosphatase (APISO): 63 U/L (ref 37–153)
BUN: 7 mg/dL (ref 7–25)
CO2: 28 mmol/L (ref 20–32)
Calcium: 9.8 mg/dL (ref 8.6–10.4)
Chloride: 102 mmol/L (ref 98–110)
Creat: 0.81 mg/dL (ref 0.50–0.99)
GFR, Est African American: 90 mL/min/{1.73_m2} (ref >=60)
GFR, Est Non African American: 77 mL/min/{1.73_m2} (ref >=60)
Globulin: 2.9 g/dL (calc) (ref 1.9–3.7)
Glucose, Bld: 104 mg/dL — ABNORMAL HIGH (ref 65–99)
Potassium: 4 mmol/L (ref 3.5–5.3)
Sodium: 138 mmol/L (ref 135–146)
Total Bilirubin: 0.6 mg/dL (ref 0.2–1.2)
Total Protein: 7.6 g/dL (ref 6.1–8.1)

## 2020-04-08 LAB — CBC
HCT: 44.5 % (ref 35.0–45.0)
Hemoglobin: 14.7 g/dL (ref 11.7–15.5)
MCH: 28.3 pg (ref 27.0–33.0)
MCHC: 33 g/dL (ref 32.0–36.0)
MCV: 85.6 fL (ref 80.0–100.0)
MPV: 12.4 fL (ref 7.5–12.5)
Platelets: 264 10*3/uL (ref 140–400)
RBC: 5.2 10*6/uL — ABNORMAL HIGH (ref 3.80–5.10)
RDW: 12.4 % (ref 11.0–15.0)
WBC: 6.6 10*3/uL (ref 3.8–10.8)

## 2020-04-08 LAB — LIPID PANEL
Cholesterol: 234 mg/dL — ABNORMAL HIGH (ref <200)
HDL: 80 mg/dL (ref >=50)
LDL Cholesterol (Calc): 134 mg/dL (calc) — ABNORMAL HIGH
Non-HDL Cholesterol (Calc): 154 mg/dL (calc) — ABNORMAL HIGH (ref <130)
Total CHOL/HDL Ratio: 2.9 (calc) (ref <5.0)
Triglycerides: 102 mg/dL (ref <150)

## 2020-04-08 LAB — T4, FREE: Free T4: 1.1 ng/dL (ref 0.8–1.8)

## 2020-04-08 LAB — TSH+FREE T4: TSH W/REFLEX TO FT4: 0.34 mIU/L — ABNORMAL LOW (ref 0.40–4.50)

## 2020-04-10 ENCOUNTER — Encounter: Payer: Self-pay | Admitting: Family Medicine

## 2020-04-10 DIAGNOSIS — R1314 Dysphagia, pharyngoesophageal phase: Secondary | ICD-10-CM | POA: Insufficient documentation

## 2020-04-10 NOTE — Assessment & Plan Note (Signed)
Blood pressure is at goal at for age and co-morbidities.  I recommend continuation of amlodipine and benazepril.  In addition they were instructed to follow a low sodium diet with regular exercise to help to maintain adequate control of blood pressure.

## 2020-04-10 NOTE — Assessment & Plan Note (Signed)
Update TFTs ° °

## 2020-04-10 NOTE — Progress Notes (Signed)
Kristine Mclaughlin - 66 y.o. female MRN [id number]  Date of birth: 04/30/1955  Subjective Chief Complaint  Patient presents with  . Medication Refill    HPI Kristine Mclaughlin is a 65 y.o. female here today for follow up visit. She has a history of HTN, HLD and arthritis.  She also has history of abnormal thyroid function tests.    Additional concern today of dysphagia.  Has sensation that food and sometimes liquids get stuck in her chest when swallowing.  Denies reflux symptoms.  No nausea or vomiting.    HTN treated with benazepril and amlodipine.  She is doing well with this.  Denies side effects related to medication.  She has not had chest pain, shortness of breath, palpitations, headache or vision changes.   HLD is managed with crestor and zetia.  She is taking omega 3 supplement as well.  She is due for updated labs.  No side effects related to medication.    ROS:  A comprehensive ROS was completed and negative except as noted per HPI  No Known Allergies  Past Medical History:  Diagnosis Date  . Hyperlipidemia   . Hypertension     Past Surgical History:  Procedure Laterality Date  . BREAST BIOPSY    . BREAST EXCISIONAL BIOPSY    . BREAST SURGERY  1992   lumpectomy--no CA    Social History   Socioeconomic History  . Marital status: Divorced    Spouse name: Not on file  . Number of children: Not on file  . Years of education: Not on file  . Highest education level: Not on file  Occupational History    Employer: [place] [employer]  Tobacco Use  . Smoking status: Never Smoker  . Smokeless tobacco: Never Used  Substance and Sexual Activity  . Alcohol use: Yes    Alcohol/week: 1.0 standard drink    Types: 1 Cans of beer per week  . Drug use: No  . Sexual activity: Yes    Partners: Male  Other Topics Concern  . Not on file  Social History Narrative   Exercise-- walks daily 3 miles   Social Determinants of Health   Financial Resource Strain: Not on file  Food  Insecurity: Not on file  Transportation Needs: Not on file  Physical Activity: Not on file  Stress: Not on file  Social Connections: Not on file    Family History  Problem Relation Age of Onset  . Diabetes Father   . Hyperlipidemia Father   . Hypertension Father   . Heart disease Father 93       1st MI--- triple bypass  . Diabetes Brother   . Heart disease Brother 56       quadruple bypass  . Hyperlipidemia Brother   . Hypertension Brother   . Coronary artery disease Other     Health Maintenance  Topic Date Due  . INFLUENZA VACCINE  09/23/2019  . PAP SMEAR-Modifier  11/30/2020  . MAMMOGRAM  02/27/2022  . TETANUS/TDAP  10/30/2024  . COLONOSCOPY (Pts 45-29yrs Insurance coverage will need to be confirmed)  12/21/2026  . COVID-19 Vaccine  Completed  . Hepatitis C Screening  Completed  . HIV Screening  Completed     ----------------------------------------------------------------------------------------------------------------------------------------------------------------------------------------------------------------- Physical Exam BP 129/71 (BP Location: Left Arm, Patient Position: Sitting, Cuff Size: Normal)   Pulse 61   Temp (!) 97.1 F (36.2 C)   Wt 164 lb (74.4 kg)   SpO2 100%   BMI 26.36 kg/m  Physical Exam Constitutional:      Appearance: Normal appearance.  HENT:     Head: Normocephalic and atraumatic.  Eyes:     General: No scleral icterus. Cardiovascular:     Rate and Rhythm: Normal rate and regular rhythm.  Pulmonary:     Effort: Pulmonary effort is normal.     Breath sounds: Normal breath sounds.  Musculoskeletal:     Cervical back: Neck supple.  Neurological:     General: No focal deficit present.     Mental Status: She is alert.  Psychiatric:        Mood and Affect: Mood normal.        Behavior: Behavior normal.      ------------------------------------------------------------------------------------------------------------------------------------------------------------------------------------------------------------------- Assessment and Plan  Essential hypertension Blood pressure is at goal at for age and co-morbidities.  I recommend continuation of amlodipine and benazepril.  In addition they were instructed to follow a low sodium diet with regular exercise to help to maintain adequate control of blood pressure.    Thyroid nodule Update TFT's  Dyslipidemia Tolerating crestor and zetia well.  Update Lipid panel and LFT's  Pharyngoesophageal dysphagia Declines trial of PPI. Referral placed to GI.     Meds ordered this encounter  Medications  . amLODipine (NORVASC) 10 MG tablet    Sig: Take 1 tablet (10 mg total) by mouth daily.    Dispense:  90 tablet    Refill:  3  . rosuvastatin (CRESTOR) 40 MG tablet    Sig: Take 1 tablet by mouth daily.    Dispense:  90 tablet    Refill:  3  . benazepril (LOTENSIN) 20 MG tablet    Sig: Take 1 tablet (20 mg total) by mouth daily.    Dispense:  90 tablet    Refill:  3    Return in about 6 months (around 10/05/2020) for HTN/HLD.    This visit occurred during the SARS-CoV-2 public health emergency.  Safety protocols were in place, including screening questions prior to the visit, additional usage of staff PPE, and extensive cleaning of exam room while observing appropriate contact time as indicated for disinfecting solutions.

## 2020-04-10 NOTE — Assessment & Plan Note (Signed)
Declines trial of PPI. Referral placed to GI.

## 2020-04-10 NOTE — Assessment & Plan Note (Signed)
Tolerating crestor and zetia well.  Update Lipid panel and LFT's

## 2020-05-12 ENCOUNTER — Encounter: Payer: Self-pay | Admitting: Family Medicine

## 2020-05-30 NOTE — Telephone Encounter (Signed)
I sent referral to [facility] they will call and schedule with patient for good appointment time - CF

## 2020-06-26 ENCOUNTER — Encounter: Payer: Self-pay | Admitting: Family Medicine

## 2020-07-23 ENCOUNTER — Other Ambulatory Visit: Payer: Self-pay | Admitting: Family Medicine

## 2020-07-23 DIAGNOSIS — E785 Hyperlipidemia, unspecified: Secondary | ICD-10-CM

## 2020-08-18 ENCOUNTER — Other Ambulatory Visit: Payer: Self-pay

## 2020-08-18 DIAGNOSIS — I10 Essential (primary) hypertension: Secondary | ICD-10-CM

## 2020-08-18 MED ORDER — BENAZEPRIL HCL 20 MG PO TABS: 20.0000 mg | ORAL_TABLET | Freq: Every day | ORAL | 3 refills | Status: DC

## 2020-09-04 DIAGNOSIS — Z23 Encounter for immunization: Secondary | ICD-10-CM | POA: Diagnosis not present

## 2020-09-16 DIAGNOSIS — R1319 Other dysphagia: Secondary | ICD-10-CM | POA: Diagnosis not present

## 2020-09-16 DIAGNOSIS — K219 Gastro-esophageal reflux disease without esophagitis: Secondary | ICD-10-CM | POA: Diagnosis not present

## 2020-10-20 ENCOUNTER — Ambulatory Visit: Payer: Federal, State, Local not specified - PPO | Admitting: Family Medicine

## 2020-11-20 ENCOUNTER — Ambulatory Visit: Payer: Federal, State, Local not specified - PPO | Admitting: Family Medicine

## 2020-11-20 ENCOUNTER — Encounter: Payer: Self-pay | Admitting: Family Medicine

## 2020-11-20 ENCOUNTER — Other Ambulatory Visit: Payer: Self-pay

## 2020-11-20 DIAGNOSIS — I10 Essential (primary) hypertension: Secondary | ICD-10-CM

## 2020-11-20 DIAGNOSIS — F411 Generalized anxiety disorder: Secondary | ICD-10-CM | POA: Diagnosis not present

## 2020-11-20 DIAGNOSIS — E785 Hyperlipidemia, unspecified: Secondary | ICD-10-CM | POA: Diagnosis not present

## 2020-11-20 MED ORDER — ALPRAZOLAM 0.5 MG PO TABS
0.2500 mg | ORAL_TABLET | Freq: Every day | ORAL | 0 refills | Status: DC | PRN
Start: 2020-11-20 — End: 2022-07-02

## 2020-11-20 NOTE — Patient Instructions (Signed)
Great to see you today! Continue current medications.  See me again in 6 months.  

## 2020-11-20 NOTE — Assessment & Plan Note (Signed)
Blood pressure remains well controlled with current medications.  Recommend continuation with low-sodium diet.

## 2020-11-20 NOTE — Assessment & Plan Note (Signed)
Updated prescription for alprazolam sent in.  She is using this sparingly.

## 2020-11-20 NOTE — Assessment & Plan Note (Signed)
She is doing well with rosuvastatin.  Not currently taking Zetia.  We will recheck lipid panel at next visit.

## 2020-11-20 NOTE — Progress Notes (Signed)
Kristine Mclaughlin - 65 y.o. female MRN [id number]  Date of birth: [birth year]  Subjective Chief Complaint  Patient presents with   Anxiety    HPI Kristine Mclaughlin is a 65 year old female here today for follow-up visit.  Since her last visit she reports increased anxiety.  This is related to increased responsibility at work.  She has had a prescription for alprazolam previously this was filled 2 years ago and is expired but still seems to be effective.  She only has 1 tablet left from this.  She is requesting a renewal of this.  Her blood pressure remains well controlled with combination of amlodipine and benazepril.  She is tolerating this well without side effects.  She denies chest pain, shortness of breath, palpitations, headache or vision changes.  She continues to tolerate rosuvastatin for management of hyperlipidemia.  Her cholesterol was elevated on previous labs however she did not want to add Zetia.  She does follow a low-fat diet and tries to stay pretty active.  ROS:  A comprehensive ROS was completed and negative except as noted per HPI  No Known Allergies  Past Medical History:  Diagnosis Date   Hyperlipidemia    Hypertension     Past Surgical History:  Procedure Laterality Date   BREAST BIOPSY     BREAST EXCISIONAL BIOPSY     BREAST SURGERY  1992   lumpectomy--no CA    Social History   Socioeconomic History   Marital status: Divorced    Spouse name: Not on file   Number of children: Not on file   Years of education: Not on file   Highest education level: Not on file  Occupational History    Employer: [employer]  Tobacco Use   Smoking status: Never   Smokeless tobacco: Never  Substance and Sexual Activity   Alcohol use: Yes    Alcohol/week: 1.0 standard drink    Types: 1 Cans of beer per week   Drug use: No   Sexual activity: Yes    Partners: Male  Other Topics Concern   Not on file  Social History Narrative   Exercise-- walks daily 3 miles    Social Determinants of Health   Financial Resource Strain: Not on file  Food Insecurity: Not on file  Transportation Needs: Not on file  Physical Activity: Not on file  Stress: Not on file  Social Connections: Not on file    Family History  Problem Relation Age of Onset   Diabetes Father    Hyperlipidemia Father    Hypertension Father    Heart disease Father 28       1st MI--- triple bypass   Diabetes Brother    Heart disease Brother 79       quadruple bypass   Hyperlipidemia Brother    Hypertension Brother    Coronary artery disease Other     Health Maintenance  Topic Date Due   PAP SMEAR-Modifier  11/30/2020   INFLUENZA VACCINE  05/22/2021 (Originally 09/22/2020)   MAMMOGRAM  02/27/2022   TETANUS/TDAP  10/30/2024   COLONOSCOPY (Pts 45-24yrs Insurance coverage will need to be confirmed)  12/21/2026   COVID-19 Vaccine  Completed   Hepatitis C Screening  Completed   HIV Screening  Completed   Zoster Vaccines- Shingrix  Completed   HPV VACCINES  Aged Out     ----------------------------------------------------------------------------------------------------------------------------------------------------------------------------------------------------------------- Physical Exam BP 119/68 (BP Location: Left Arm, Patient Position: Sitting, Cuff Size: Normal)   Pulse 63   Temp Marland Kitchen)  97.3 F (36.3 C)   Ht 5\' 6"  (1.676 m)   Wt 155 lb (70.3 kg)   SpO2 100%   BMI 25.02 kg/m   Physical Exam Constitutional:      Appearance: Normal appearance.  Eyes:     General: No scleral icterus. Cardiovascular:     Rate and Rhythm: Normal rate and regular rhythm.  Pulmonary:     Effort: Pulmonary effort is normal.     Breath sounds: Normal breath sounds.  Musculoskeletal:     Cervical back: Neck supple.  Neurological:     General: No focal deficit present.     Mental Status: She is alert.  Psychiatric:        Mood and Affect: Mood normal.        Behavior: Behavior normal.     ------------------------------------------------------------------------------------------------------------------------------------------------------------------------------------------------------------------- Assessment and Plan  Essential hypertension Blood pressure remains well controlled with current medications.  Recommend continuation with low-sodium diet.  Dyslipidemia She is doing well with rosuvastatin.  Not currently taking Zetia.  We will recheck lipid panel at next visit.  Anxiety state Updated prescription for alprazolam sent in.  She is using this sparingly.   Meds ordered this encounter  Medications   ALPRAZolam (XANAX) 0.5 MG tablet    Sig: Take 0.5-1 tablets (0.25-0.5 mg total) by mouth daily as needed for anxiety.    Dispense:  30 tablet    Refill:  0    Return in about 6 months (around 05/20/2021) for HTN/HLD.    This visit occurred during the SARS-CoV-2 public health emergency.  Safety protocols were in place, including screening questions prior to the visit, additional usage of staff PPE, and extensive cleaning of exam room while observing appropriate contact time as indicated for disinfecting solutions.

## 2021-01-02 ENCOUNTER — Encounter: Payer: Self-pay | Admitting: Family Medicine

## 2021-01-02 ENCOUNTER — Ambulatory Visit (INDEPENDENT_AMBULATORY_CARE_PROVIDER_SITE_OTHER): Payer: Federal, State, Local not specified - PPO | Admitting: Family Medicine

## 2021-01-02 ENCOUNTER — Other Ambulatory Visit: Payer: Self-pay

## 2021-01-02 VITALS — BP 115/77 | HR 75 | Temp 97.6°F | Ht 66.0 in | Wt 160.1 lb

## 2021-01-02 DIAGNOSIS — Z23 Encounter for immunization: Secondary | ICD-10-CM | POA: Diagnosis not present

## 2021-01-02 DIAGNOSIS — M41125 Adolescent idiopathic scoliosis, thoracolumbar region: Secondary | ICD-10-CM

## 2021-01-04 NOTE — Progress Notes (Signed)
Kristine Mclaughlin - 65 y.o. female MRN [id number]  Date of birth: May 11, 1955  Subjective Chief Complaint  Patient presents with   fmla   Immunizations    HPI Kristine Mclaughlin is a 65 year old female here today for follow-up.  She has history of scoliosis which causes intermittent episodes of back pain.  During this time she has unable to complete the tasks required of her at work including prolonged sitting or standing.  She has had FMLA paperwork completed for this in the past and is requesting renewal of this.  She would also like to have updated flu vaccine today.  ROS:  A comprehensive ROS was completed and negative except as noted per HPI  No Known Allergies  Past Medical History:  Diagnosis Date   Hyperlipidemia    Hypertension     Past Surgical History:  Procedure Laterality Date   BREAST BIOPSY     BREAST EXCISIONAL BIOPSY     BREAST SURGERY  1992   lumpectomy--no CA    Social History   Socioeconomic History   Marital status: Divorced    Spouse name: Not on file   Number of children: Not on file   Years of education: Not on file   Highest education level: Not on file  Occupational History    Employer: [employer]  Tobacco Use   Smoking status: Never   Smokeless tobacco: Never  Substance and Sexual Activity   Alcohol use: Yes    Alcohol/week: 1.0 standard drink    Types: 1 Cans of beer per week   Drug use: No   Sexual activity: Yes    Partners: Male  Other Topics Concern   Not on file  Social History Narrative   Exercise-- walks daily 3 miles   Social Determinants of Health   Financial Resource Strain: Not on file  Food Insecurity: Not on file  Transportation Needs: Not on file  Physical Activity: Not on file  Stress: Not on file  Social Connections: Not on file    Family History  Problem Relation Age of Onset   Diabetes Father    Hyperlipidemia Father    Hypertension Father    Heart disease Father 11       1st MI--- triple bypass    Diabetes Brother    Heart disease Brother 51       quadruple bypass   Hyperlipidemia Brother    Hypertension Brother    Coronary artery disease Other     Health Maintenance  Topic Date Due   COVID-19 Vaccine (5 - Booster for Pfizer series) 10/30/2020   PAP SMEAR-Modifier  11/30/2020   MAMMOGRAM  02/27/2022   TETANUS/TDAP  10/30/2024   COLONOSCOPY (Pts 45-104yrs Insurance coverage will need to be confirmed)  12/21/2026   INFLUENZA VACCINE  Completed   Hepatitis C Screening  Completed   HIV Screening  Completed   Zoster Vaccines- Shingrix  Completed   Pneumococcal Vaccine 33-42 Years old  Aged Out   HPV VACCINES  Aged Out     ----------------------------------------------------------------------------------------------------------------------------------------------------------------------------------------------------------------- Physical Exam BP 115/77 (BP Location: Left Arm, Patient Position: Sitting, Cuff Size: Normal)   Pulse 75   Temp 97.6 F (36.4 C)   Ht 5\' 6"  (1.676 m)   Wt 160 lb 1.6 oz (72.6 kg)   SpO2 98%   BMI 25.84 kg/m   Physical Exam Constitutional:      Appearance: Normal appearance.  HENT:     Head: Normocephalic and atraumatic.  Cardiovascular:  Rate and Rhythm: Normal rate and regular rhythm.  Musculoskeletal:     Cervical back: Neck supple.  Skin:    General: Skin is warm and dry.  Neurological:     General: No focal deficit present.     Mental Status: She is alert.  Psychiatric:        Mood and Affect: Mood normal.        Behavior: Behavior normal.    ------------------------------------------------------------------------------------------------------------------------------------------------------------------------------------------------------------------- Assessment and Plan  Adolescent idiopathic scoliosis Intermittent flares of back pain resulting in this time at work.  Updated FMLA paperwork completed today.   No orders of  the defined types were placed in this encounter.   No follow-ups on file.    This visit occurred during the SARS-CoV-2 public health emergency.  Safety protocols were in place, including screening questions prior to the visit, additional usage of staff PPE, and extensive cleaning of exam room while observing appropriate contact time as indicated for disinfecting solutions.

## 2021-01-04 NOTE — Assessment & Plan Note (Signed)
Intermittent flares of back pain resulting in this time at work.  Updated FMLA paperwork completed today.

## 2021-01-26 ENCOUNTER — Encounter: Payer: Self-pay | Admitting: Family Medicine

## 2021-01-28 ENCOUNTER — Encounter: Payer: Self-pay | Admitting: Family Medicine

## 2021-03-02 ENCOUNTER — Encounter: Payer: Self-pay | Admitting: Family Medicine

## 2021-03-09 ENCOUNTER — Ambulatory Visit: Payer: Federal, State, Local not specified - PPO | Admitting: Sports Medicine

## 2021-03-09 ENCOUNTER — Ambulatory Visit (INDEPENDENT_AMBULATORY_CARE_PROVIDER_SITE_OTHER): Payer: Federal, State, Local not specified - PPO

## 2021-03-09 ENCOUNTER — Other Ambulatory Visit: Payer: Self-pay

## 2021-03-09 DIAGNOSIS — G8929 Other chronic pain: Secondary | ICD-10-CM

## 2021-03-09 DIAGNOSIS — M1712 Unilateral primary osteoarthritis, left knee: Secondary | ICD-10-CM

## 2021-03-09 DIAGNOSIS — M1711 Unilateral primary osteoarthritis, right knee: Secondary | ICD-10-CM | POA: Diagnosis not present

## 2021-03-09 DIAGNOSIS — M25561 Pain in right knee: Secondary | ICD-10-CM

## 2021-03-09 MED ORDER — CELECOXIB 200 MG PO CAPS: ORAL_CAPSULE | ORAL | 2 refills | Status: DC

## 2021-03-09 NOTE — Progress Notes (Signed)
° ° °  Procedures performed today:    None.  Independent interpretation of notes and tests performed by another provider:   None.  Brief History, Exam, Impression, and Recommendations:    Primary osteoarthritis of left knee This is a pleasant 66 year old female, I saw her 7 years ago for knee osteoarthritis, she did well with NSAIDs. Now having recurrence of pain, posterior/lateral joint line, gelling, repeating x-rays, adding Celebrex, formal PT. To see me in 6 weeks, injection if not better.    ___________________________________________ Gwen Her. Dianah Field, M.D., ABFM., CAQSM. Primary Care and [place] Instructor of [employer] [facility] [employer] [employer]

## 2021-03-09 NOTE — Assessment & Plan Note (Signed)
This is a pleasant 66 year old female, I saw her 7 years ago for knee osteoarthritis, she did well with NSAIDs. Now having recurrence of pain, posterior/lateral joint line, gelling, repeating x-rays, adding Celebrex, formal PT. To see me in 6 weeks, injection if not better.

## 2021-03-26 ENCOUNTER — Ambulatory Visit: Payer: Federal, State, Local not specified - PPO | Admitting: Rehabilitative and Restorative Service Providers"

## 2021-04-09 ENCOUNTER — Ambulatory Visit: Payer: Federal, State, Local not specified - PPO | Admitting: Physical Therapy

## 2021-04-13 ENCOUNTER — Other Ambulatory Visit: Payer: Self-pay | Admitting: Family Medicine

## 2021-04-13 DIAGNOSIS — I10 Essential (primary) hypertension: Secondary | ICD-10-CM

## 2021-04-17 ENCOUNTER — Other Ambulatory Visit: Payer: Self-pay | Admitting: Family Medicine

## 2021-04-17 DIAGNOSIS — Z1231 Encounter for screening mammogram for malignant neoplasm of breast: Secondary | ICD-10-CM

## 2021-04-20 ENCOUNTER — Ambulatory Visit: Payer: Federal, State, Local not specified - PPO | Admitting: Sports Medicine

## 2021-05-19 ENCOUNTER — Encounter: Payer: Self-pay | Admitting: Family Medicine

## 2021-05-19 DIAGNOSIS — Z0000 Encounter for general adult medical examination without abnormal findings: Secondary | ICD-10-CM

## 2021-05-19 DIAGNOSIS — I10 Essential (primary) hypertension: Secondary | ICD-10-CM

## 2021-05-19 DIAGNOSIS — E785 Hyperlipidemia, unspecified: Secondary | ICD-10-CM

## 2021-05-19 DIAGNOSIS — E041 Nontoxic single thyroid nodule: Secondary | ICD-10-CM

## 2021-05-19 MED ORDER — ROSUVASTATIN CALCIUM 40 MG PO TABS: ORAL_TABLET | ORAL | 3 refills | Status: DC

## 2021-05-19 NOTE — Telephone Encounter (Signed)
Lab orders signed.    Thanks!  CM 

## 2021-05-20 ENCOUNTER — Ambulatory Visit: Payer: Federal, State, Local not specified - PPO | Admitting: Family Medicine

## 2021-05-20 DIAGNOSIS — I10 Essential (primary) hypertension: Secondary | ICD-10-CM | POA: Diagnosis not present

## 2021-05-20 DIAGNOSIS — Z0000 Encounter for general adult medical examination without abnormal findings: Secondary | ICD-10-CM | POA: Diagnosis not present

## 2021-05-20 DIAGNOSIS — E785 Hyperlipidemia, unspecified: Secondary | ICD-10-CM | POA: Diagnosis not present

## 2021-05-20 DIAGNOSIS — E041 Nontoxic single thyroid nodule: Secondary | ICD-10-CM | POA: Diagnosis not present

## 2021-05-21 LAB — COMPLETE METABOLIC PANEL WITH GFR
AG Ratio: 1.8 (calc) (ref 1.0–2.5)
ALT: 38 U/L — ABNORMAL HIGH (ref 6–29)
AST: 34 U/L (ref 10–35)
Albumin: 4.8 g/dL (ref 3.6–5.1)
Alkaline phosphatase (APISO): 60 U/L (ref 37–153)
BUN: 11 mg/dL (ref 7–25)
CO2: 27 mmol/L (ref 20–32)
Calcium: 9.5 mg/dL (ref 8.6–10.4)
Chloride: 106 mmol/L (ref 98–110)
Creat: 0.9 mg/dL (ref 0.50–1.05)
Globulin: 2.6 g/dL (calc) (ref 1.9–3.7)
Glucose, Bld: 103 mg/dL — ABNORMAL HIGH (ref 65–99)
Potassium: 4.3 mmol/L (ref 3.5–5.3)
Sodium: 143 mmol/L (ref 135–146)
Total Bilirubin: 0.8 mg/dL (ref 0.2–1.2)
Total Protein: 7.4 g/dL (ref 6.1–8.1)
eGFR: 71 mL/min/{1.73_m2} (ref >=60)

## 2021-05-21 LAB — CBC WITH DIFFERENTIAL/PLATELET
Absolute Monocytes: 329 cells/uL (ref 200–950)
Basophils Absolute: 22 cells/uL (ref 0–200)
Basophils Relative: 0.4 %
Eosinophils Absolute: 167 cells/uL (ref 15–500)
Eosinophils Relative: 3.1 %
HCT: 42.4 % (ref 35.0–45.0)
Hemoglobin: 13.8 g/dL (ref 11.7–15.5)
Lymphs Abs: 1998 cells/uL (ref 850–3900)
MCH: 28.2 pg (ref 27.0–33.0)
MCHC: 32.5 g/dL (ref 32.0–36.0)
MCV: 86.7 fL (ref 80.0–100.0)
MPV: 12 fL (ref 7.5–12.5)
Monocytes Relative: 6.1 %
Neutro Abs: 2884 cells/uL (ref 1500–7800)
Neutrophils Relative %: 53.4 %
Platelets: 273 10*3/uL (ref 140–400)
RBC: 4.89 10*6/uL (ref 3.80–5.10)
RDW: 13.1 % (ref 11.0–15.0)
Total Lymphocyte: 37 %
WBC: 5.4 10*3/uL (ref 3.8–10.8)

## 2021-05-21 LAB — LIPID PANEL W/REFLEX DIRECT LDL
Cholesterol: 238 mg/dL — ABNORMAL HIGH (ref <200)
HDL: 89 mg/dL (ref >=50)
LDL Cholesterol (Calc): 130 mg/dL (calc) — ABNORMAL HIGH
Non-HDL Cholesterol (Calc): 149 mg/dL (calc) — ABNORMAL HIGH (ref <130)
Total CHOL/HDL Ratio: 2.7 (calc) (ref <5.0)
Triglycerides: 93 mg/dL (ref <150)

## 2021-05-21 LAB — TSH+FREE T4: TSH W/REFLEX TO FT4: 0.37 mIU/L — ABNORMAL LOW (ref 0.40–4.50)

## 2021-05-21 LAB — T4, FREE: Free T4: 1.1 ng/dL (ref 0.8–1.8)

## 2021-06-04 ENCOUNTER — Encounter: Payer: Self-pay | Admitting: Family Medicine

## 2021-06-22 ENCOUNTER — Ambulatory Visit: Payer: Federal, State, Local not specified - PPO | Admitting: Family Medicine

## 2021-06-22 ENCOUNTER — Encounter: Payer: Self-pay | Admitting: Family Medicine

## 2021-06-22 DIAGNOSIS — I10 Essential (primary) hypertension: Secondary | ICD-10-CM

## 2021-06-22 DIAGNOSIS — R600 Localized edema: Secondary | ICD-10-CM

## 2021-06-22 MED ORDER — AMLODIPINE BESYLATE 5 MG PO TABS: ORAL_TABLET | ORAL | 1 refills | Status: DC

## 2021-06-22 MED ORDER — BENAZEPRIL-HYDROCHLOROTHIAZIDE 20-12.5 MG PO TABS: 1.0000 | ORAL_TABLET | Freq: Every day | ORAL | 1 refills | Status: DC

## 2021-06-22 NOTE — Assessment & Plan Note (Signed)
Venous insufficiency likely contributing.  May use compression stocking PRN.  Continue to stay active.  ?Reducing amlodipine to 5mg  and changing benazepril to benazepril/hctz 20/12.5mg .  F/u in 6 weeks.  ? ?

## 2021-06-22 NOTE — Progress Notes (Signed)
[name] - 66 y.o. female MRN [id number]  Date of birth: Aug 16, 1955 ? ?Subjective ?Chief Complaint  ?Patient presents with  ? Joint Swelling  ? ? ?HPI ?Kristine Mclaughlin is a 66 y.o. female here today with complaint of bilateral lower extremity swelling.  She has noticed this for the past 4-5 weeks.  Recently started back in office setting and is sitting a little more throughout the day.  She does try to walk a few miles per day but notices some increased swelling after walking. A family member who is a physician suggested she should take a diurectic.  She has been taking an OTC diuretic without much effect  She denies shortness of breath, chest pain, palpitations or other cardiac symptoms.  She has noted improvement with compression socks but doesn't like to wear these.  ? ?ROS:  A comprehensive ROS was completed and negative except as noted per HPI ?  ?No Known Allergies ? ?Past Medical History:  ?Diagnosis Date  ? Hyperlipidemia   ? Hypertension   ? ? ?Past Surgical History:  ?Procedure Laterality Date  ? BREAST BIOPSY    ? BREAST EXCISIONAL BIOPSY    ? BREAST SURGERY  1992  ? lumpectomy--no CA  ? ? ?Social History  ? ?Socioeconomic History  ? Marital status: Divorced  ?  Spouse name: Not on file  ? Number of children: Not on file  ? Years of education: Not on file  ? Highest education level: Not on file  ?Occupational History  ?  Employer: [employer]  ?Tobacco Use  ? Smoking status: Never  ? Smokeless tobacco: Never  ?Substance and Sexual Activity  ? Alcohol use: Yes  ?  Alcohol/week: 1.0 standard drink  ?  Types: 1 Cans of beer per week  ? Drug use: No  ? Sexual activity: Yes  ?  Partners: Male  ?Other Topics Concern  ? Not on file  ?Social History Narrative  ? Exercise-- walks daily 3 miles  ? ?Social Determinants of Health  ? ?Financial Resource Strain: Not on file  ?Food Insecurity: Not on file  ?Transportation Needs: Not on file  ?Physical Activity: Not on file  ?Stress: Not on file  ?Social  Connections: Not on file  ? ? ?Family History  ?Problem Relation Age of Onset  ? Diabetes Father   ? Hyperlipidemia Father   ? Hypertension Father   ? Heart disease Father 8  ?     1st MI--- triple bypass  ? Diabetes Brother   ? Heart disease Brother 12  ?     quadruple bypass  ? Hyperlipidemia Brother   ? Hypertension Brother   ? Coronary artery disease Other   ? ? ?Health Maintenance  ?Topic Date Due  ? COVID-19 Vaccine (5 - Booster for Pfizer series) 10/30/2020  ? PAP SMEAR-Modifier  11/30/2020  ? INFLUENZA VACCINE  09/22/2021  ? MAMMOGRAM  02/27/2022  ? TETANUS/TDAP  10/30/2024  ? COLONOSCOPY (Pts 45-8yrs Insurance coverage will need to be confirmed)  12/21/2026  ? Hepatitis C Screening  Completed  ? HIV Screening  Completed  ? Zoster Vaccines- Shingrix  Completed  ? HPV VACCINES  Aged Out  ? ? ? ?----------------------------------------------------------------------------------------------------------------------------------------------------------------------------------------------------------------- ?Physical Exam ?BP 137/85 (BP Location: Left Arm, Patient Position: Sitting, Cuff Size: Normal)   Ht 5\' 5"  (1.651 m)   Wt 158 lb (71.7 kg)   SpO2 99%   BMI 26.29 kg/m?  ? ?Physical Exam ?Constitutional:   ?   Appearance:  Normal appearance.  ?Eyes:  ?   General: No scleral icterus. ?Cardiovascular:  ?   Rate and Rhythm: Normal rate and regular rhythm.  ?Musculoskeletal:  ?   Cervical back: Neck supple.  ?Neurological:  ?   General: No focal deficit present.  ?   Mental Status: She is alert.  ?Psychiatric:     ?   Mood and Affect: Mood normal.     ?   Behavior: Behavior normal.  ? ? ?------------------------------------------------------------------------------------------------------------------------------------------------------------------------------------------------------------------- ?Assessment and Plan ? ?Lower extremity edema ?Venous insufficiency likely contributing.  May use compression stocking  PRN.  Continue to stay active.  ?Reducing amlodipine to 5mg  and changing benazepril to benazepril/hctz 20/12.5mg .  F/u in 6 weeks.  ? ? ?Meds ordered this encounter  ?Medications  ? benazepril-hydrochlorthiazide (LOTENSIN HCT) 20-12.5 MG tablet  ?  Sig: Take 1 tablet by mouth daily.  ?  Dispense:  90 tablet  ?  Refill:  1  ? amLODipine (NORVASC) 5 MG tablet  ?  Sig: TAKE 1 TABLET PO DAILY  ?  Dispense:  90 tablet  ?  Refill:  1  ? ? ?No follow-ups on file. ? ? ? ?This visit occurred during the SARS-CoV-2 public health emergency.  Safety protocols were in place, including screening questions prior to the visit, additional usage of staff PPE, and extensive cleaning of exam room while observing appropriate contact time as indicated for disinfecting solutions.  ? ?

## 2021-06-22 NOTE — Patient Instructions (Signed)
Start benazepril/hctz to replace benazepril.  ?Reduce amlodipine to 1/2 tab (5mg ) daily.  ?Continue to walk regularly and stay active.  ?Check BP daily for the next 2 weeks.  ?See me again in 6 weeks.  ? ? ?

## 2021-07-13 ENCOUNTER — Encounter: Payer: Self-pay | Admitting: Family Medicine

## 2021-07-13 DIAGNOSIS — R600 Localized edema: Secondary | ICD-10-CM

## 2021-07-14 NOTE — Telephone Encounter (Signed)
Referral entered per patient request

## 2021-07-16 ENCOUNTER — Ambulatory Visit: Payer: Federal, State, Local not specified - PPO

## 2021-08-11 ENCOUNTER — Other Ambulatory Visit: Payer: Self-pay | Admitting: *Deleted

## 2021-08-11 DIAGNOSIS — R600 Localized edema: Secondary | ICD-10-CM

## 2021-08-18 ENCOUNTER — Ambulatory Visit: Payer: Federal, State, Local not specified - PPO | Admitting: Vascular Surgery

## 2021-08-18 ENCOUNTER — Ambulatory Visit (HOSPITAL_COMMUNITY)
Admission: RE | Admit: 2021-08-18 | Discharge: 2021-08-18 | Disposition: A | Discharge status: Home / Self Care | Payer: Federal, State, Local not specified - PPO | Source: Ambulatory Visit | Attending: Vascular Surgery | Admitting: Vascular Surgery

## 2021-08-18 ENCOUNTER — Encounter: Payer: Self-pay | Admitting: Vascular Surgery

## 2021-08-18 VITALS — BP 131/84 | HR 57 | Temp 98.2°F | Resp 20 | Ht 65.0 in | Wt 157.3 lb

## 2021-08-18 DIAGNOSIS — I872 Venous insufficiency (chronic) (peripheral): Secondary | ICD-10-CM | POA: Diagnosis not present

## 2021-08-18 DIAGNOSIS — R600 Localized edema: Secondary | ICD-10-CM | POA: Insufficient documentation

## 2021-08-18 DIAGNOSIS — M7989 Other specified soft tissue disorders: Secondary | ICD-10-CM

## 2021-08-19 ENCOUNTER — Ambulatory Visit: Payer: Federal, State, Local not specified - PPO

## 2021-08-20 ENCOUNTER — Other Ambulatory Visit: Payer: Self-pay

## 2021-08-20 DIAGNOSIS — E785 Hyperlipidemia, unspecified: Secondary | ICD-10-CM

## 2021-08-20 MED ORDER — EZETIMIBE 10 MG PO TABS: ORAL_TABLET | ORAL | 3 refills | Status: DC

## 2021-08-26 ENCOUNTER — Other Ambulatory Visit: Payer: Self-pay

## 2021-08-26 DIAGNOSIS — R600 Localized edema: Secondary | ICD-10-CM

## 2021-08-26 DIAGNOSIS — I872 Venous insufficiency (chronic) (peripheral): Secondary | ICD-10-CM

## 2021-10-13 ENCOUNTER — Ambulatory Visit (HOSPITAL_COMMUNITY)
Admission: RE | Admit: 2021-10-13 | Discharge: 2021-10-13 | Disposition: A | Discharge status: Home / Self Care | Payer: Federal, State, Local not specified - PPO | Source: Ambulatory Visit | Attending: Vascular Surgery | Admitting: Vascular Surgery

## 2021-10-13 DIAGNOSIS — R600 Localized edema: Secondary | ICD-10-CM | POA: Diagnosis not present

## 2021-10-13 DIAGNOSIS — I872 Venous insufficiency (chronic) (peripheral): Secondary | ICD-10-CM | POA: Insufficient documentation

## 2021-10-15 ENCOUNTER — Ambulatory Visit (INDEPENDENT_AMBULATORY_CARE_PROVIDER_SITE_OTHER): Payer: Federal, State, Local not specified - PPO

## 2021-10-15 DIAGNOSIS — Z1231 Encounter for screening mammogram for malignant neoplasm of breast: Secondary | ICD-10-CM | POA: Diagnosis not present

## 2021-10-20 ENCOUNTER — Ambulatory Visit (INDEPENDENT_AMBULATORY_CARE_PROVIDER_SITE_OTHER): Payer: Federal, State, Local not specified - PPO | Admitting: Vascular Surgery

## 2021-10-20 DIAGNOSIS — M7989 Other specified soft tissue disorders: Secondary | ICD-10-CM | POA: Diagnosis not present

## 2021-10-20 NOTE — Progress Notes (Signed)
Reviewed ABI with patient. Reassured her she has normal flow to her feet. She can follow up with me as needed.  Rande Brunt. Lenell Antu, MD [facility] Phone Number: [phone] 10/20/2021 4:36 PM

## 2021-10-22 ENCOUNTER — Ambulatory Visit: Payer: Federal, State, Local not specified - PPO | Admitting: Family Medicine

## 2021-11-30 ENCOUNTER — Ambulatory Visit: Payer: Federal, State, Local not specified - PPO | Admitting: Sports Medicine

## 2021-11-30 DIAGNOSIS — M1712 Unilateral primary osteoarthritis, left knee: Secondary | ICD-10-CM

## 2021-11-30 NOTE — Progress Notes (Signed)
    Procedures performed today:    None.  Independent interpretation of notes and tests performed by another provider:   None.  Brief History, Exam, Impression, and Recommendations:    Primary osteoarthritis of left knee Pleasant 66 year old female, known knee osteoarthritis, she has Celebrex, we have given her home physical therapy. She had a few days of increasing swelling and pain which then resolved on its own. We talked about home remedies including icing, compression, elevation and NSAIDs, we discussed things to avoid in the gym and with exercising, she will however restart the home physical therapy handout. If she does have a recurrence of pain she will do all of the above for a month for coming in to see me at which point we would do an injection.    ____________________________________________ Gwen Her. Dianah Field, M.D., ABFM., CAQSM., AME. Primary Care and Sports Medicine [facility]  Adjunct Professor of [employer] [facility] [facility] [employer] Medicine  Risk manager

## 2021-11-30 NOTE — Assessment & Plan Note (Signed)
Pleasant 66 year old female, known knee osteoarthritis, she has Celebrex, we have given her home physical therapy. She had a few days of increasing swelling and pain which then resolved on its own. We talked about home remedies including icing, compression, elevation and NSAIDs, we discussed things to avoid in the gym and with exercising, she will however restart the home physical therapy handout. If she does have a recurrence of pain she will do all of the above for a month for coming in to see me at which point we would do an injection.

## 2022-01-11 ENCOUNTER — Other Ambulatory Visit: Payer: Self-pay | Admitting: Family Medicine

## 2022-01-11 NOTE — Telephone Encounter (Signed)
Please contact patient to schedule HTN follow-up appt.  Sending 30 day medication refill.

## 2022-01-12 NOTE — Telephone Encounter (Signed)
Lvm for patient to call back to schedule a appointment for HTN follow up with Dr.Matthews. tvt

## 2022-02-11 ENCOUNTER — Other Ambulatory Visit: Payer: Self-pay | Admitting: Family Medicine

## 2022-02-11 DIAGNOSIS — I10 Essential (primary) hypertension: Secondary | ICD-10-CM

## 2022-02-11 NOTE — Telephone Encounter (Signed)
Pls contact the patient to schedule follow-up appt. Sending 30 day medication refill. Past due for 6 week follow-up since 06/2021

## 2022-02-11 NOTE — Telephone Encounter (Signed)
Lvm for patient to call back to schedule a med refill appointment for   benazepril-hydrochlorthiazide (LOTENSIN HCT) 20-12.5 MG tablet

## 2022-03-13 ENCOUNTER — Other Ambulatory Visit: Payer: Self-pay | Admitting: Family Medicine

## 2022-03-13 DIAGNOSIS — I10 Essential (primary) hypertension: Secondary | ICD-10-CM

## 2022-03-15 NOTE — Telephone Encounter (Signed)
Please contact patient to schedule HTN follow-up. Past due appt. Sending 30 day med refill. Thanks

## 2022-03-15 NOTE — Telephone Encounter (Signed)
Pt wants to know if she would also need fasting blood work along with her HTN f/u

## 2022-03-17 ENCOUNTER — Other Ambulatory Visit: Payer: Self-pay

## 2022-03-17 DIAGNOSIS — I10 Essential (primary) hypertension: Secondary | ICD-10-CM

## 2022-03-17 MED ORDER — BENAZEPRIL-HYDROCHLOROTHIAZIDE 20-12.5 MG PO TABS: 1.0000 | ORAL_TABLET | Freq: Every day | ORAL | 0 refills | Status: DC

## 2022-03-17 MED ORDER — AMLODIPINE BESYLATE 5 MG PO TABS: ORAL_TABLET | ORAL | 0 refills | Status: DC

## 2022-03-17 NOTE — Progress Notes (Signed)
Lvm for patient to call back to schedule a appointment for HTN with Dr. Zigmund Daniel. tvt

## 2022-05-25 ENCOUNTER — Other Ambulatory Visit: Payer: Self-pay | Admitting: Family Medicine

## 2022-05-25 DIAGNOSIS — I10 Essential (primary) hypertension: Secondary | ICD-10-CM

## 2022-05-25 MED ORDER — AMLODIPINE BESYLATE 5 MG PO TABS: ORAL_TABLET | ORAL | 0 refills | Status: DC

## 2022-05-25 MED ORDER — BENAZEPRIL-HYDROCHLOROTHIAZIDE 20-12.5 MG PO TABS: 1.0000 | ORAL_TABLET | Freq: Every day | ORAL | 0 refills | Status: DC

## 2022-05-25 NOTE — Telephone Encounter (Signed)
Called patient, LVM to call office to schedule appointment, thanks.

## 2022-05-25 NOTE — Telephone Encounter (Signed)
Pls contact the patient to schedule appt for HTN. Past due. Unable to send medication refill without scheduled appt. Thanks

## 2022-05-31 ENCOUNTER — Encounter: Payer: Self-pay | Admitting: Family Medicine

## 2022-05-31 ENCOUNTER — Other Ambulatory Visit: Payer: Self-pay

## 2022-05-31 DIAGNOSIS — E785 Hyperlipidemia, unspecified: Secondary | ICD-10-CM

## 2022-05-31 MED ORDER — ROSUVASTATIN CALCIUM 40 MG PO TABS: ORAL_TABLET | ORAL | 0 refills | Status: DC

## 2022-06-19 ENCOUNTER — Other Ambulatory Visit: Payer: Self-pay | Admitting: Family Medicine

## 2022-06-19 DIAGNOSIS — I10 Essential (primary) hypertension: Secondary | ICD-10-CM

## 2022-07-02 ENCOUNTER — Encounter: Payer: Self-pay | Admitting: Family Medicine

## 2022-07-02 ENCOUNTER — Other Ambulatory Visit: Payer: Self-pay | Admitting: Family Medicine

## 2022-07-02 ENCOUNTER — Ambulatory Visit (INDEPENDENT_AMBULATORY_CARE_PROVIDER_SITE_OTHER): Payer: Federal, State, Local not specified - PPO | Admitting: Family Medicine

## 2022-07-02 VITALS — BP 105/67 | HR 66 | Ht 65.0 in | Wt 153.0 lb

## 2022-07-02 DIAGNOSIS — H9312 Tinnitus, left ear: Secondary | ICD-10-CM

## 2022-07-02 DIAGNOSIS — I10 Essential (primary) hypertension: Secondary | ICD-10-CM | POA: Diagnosis not present

## 2022-07-02 DIAGNOSIS — E785 Hyperlipidemia, unspecified: Secondary | ICD-10-CM | POA: Diagnosis not present

## 2022-07-02 DIAGNOSIS — Z0000 Encounter for general adult medical examination without abnormal findings: Secondary | ICD-10-CM

## 2022-07-02 DIAGNOSIS — E041 Nontoxic single thyroid nodule: Secondary | ICD-10-CM | POA: Diagnosis not present

## 2022-07-02 DIAGNOSIS — Z780 Asymptomatic menopausal state: Secondary | ICD-10-CM

## 2022-07-02 DIAGNOSIS — Z1231 Encounter for screening mammogram for malignant neoplasm of breast: Secondary | ICD-10-CM

## 2022-07-02 DIAGNOSIS — M1712 Unilateral primary osteoarthritis, left knee: Secondary | ICD-10-CM

## 2022-07-02 LAB — CBC WITH DIFFERENTIAL/PLATELET
Basophils Absolute: 29 cells/uL (ref 0–200)
Neutro Abs: 2813 cells/uL (ref 1500–7800)
RBC: 4.57 10*6/uL (ref 3.80–5.10)
RDW: 12.9 % (ref 11.0–15.0)

## 2022-07-02 MED ORDER — CELECOXIB 200 MG PO CAPS: ORAL_CAPSULE | ORAL | 3 refills | Status: DC

## 2022-07-02 MED ORDER — EZETIMIBE 10 MG PO TABS: ORAL_TABLET | ORAL | 3 refills | Status: DC

## 2022-07-02 MED ORDER — BENAZEPRIL-HYDROCHLOROTHIAZIDE 20-12.5 MG PO TABS: 1.0000 | ORAL_TABLET | Freq: Every day | ORAL | 3 refills | Status: DC

## 2022-07-02 MED ORDER — ROSUVASTATIN CALCIUM 40 MG PO TABS: ORAL_TABLET | ORAL | 3 refills | Status: DC

## 2022-07-02 MED ORDER — AMLODIPINE BESYLATE 2.5 MG PO TABS: ORAL_TABLET | ORAL | 3 refills | Status: DC

## 2022-07-02 NOTE — Patient Instructions (Addendum)
Reduce amlodipine to 1/2 tab daily.   Preventive Care 52 Years and Older, Female Preventive care refers to lifestyle choices and visits with your health care provider that can promote health and wellness. Preventive care visits are also called wellness exams. What can I expect for my preventive care visit? Counseling Your health care provider may ask you questions about your: Medical history, including: Past medical problems. Family medical history. Pregnancy and menstrual history. History of falls. Current health, including: Memory and ability to understand (cognition). Emotional well-being. Home life and relationship well-being. Sexual activity and sexual health. Lifestyle, including: Alcohol, nicotine or tobacco, and drug use. Access to firearms. Diet, exercise, and sleep habits. Work and work Astronomer. Sunscreen use. Safety issues such as seatbelt and bike helmet use. Physical exam Your health care provider will check your: Height and weight. These may be used to calculate your BMI (body mass index). BMI is a measurement that tells if you are at a healthy weight. Waist circumference. This measures the distance around your waistline. This measurement also tells if you are at a healthy weight and may help predict your risk of certain diseases, such as type 2 diabetes and high blood pressure. Heart rate and blood pressure. Body temperature. Skin for abnormal spots. What immunizations do I need?  Vaccines are usually given at various ages, according to a schedule. Your health care provider will recommend vaccines for you based on your age, medical history, and lifestyle or other factors, such as travel or where you work. What tests do I need? Screening Your health care provider may recommend screening tests for certain conditions. This may include: Lipid and cholesterol levels. Hepatitis C test. Hepatitis B test. HIV (human immunodeficiency virus) test. STI (sexually  transmitted infection) testing, if you are at risk. Lung cancer screening. Colorectal cancer screening. Diabetes screening. This is done by checking your blood sugar (glucose) after you have not eaten for a while (fasting). Mammogram. Talk with your health care provider about how often you should have regular mammograms. BRCA-related cancer screening. This may be done if you have a family history of breast, ovarian, tubal, or peritoneal cancers. Bone density scan. This is done to screen for osteoporosis. Talk with your health care provider about your test results, treatment options, and if necessary, the need for more tests. Follow these instructions at home: Eating and drinking  Eat a diet that includes fresh fruits and vegetables, whole grains, lean protein, and low-fat dairy products. Limit your intake of foods with high amounts of sugar, saturated fats, and salt. Take vitamin and mineral supplements as recommended by your health care provider. Do not drink alcohol if your health care provider tells you not to drink. If you drink alcohol: Limit how much you have to 0-1 drink a day. Know how much alcohol is in your drink. In the [place] one drink equals one 12 oz bottle of beer (355 mL), one 5 oz glass of wine (148 mL), or one 1 oz glass of hard liquor (44 mL). Lifestyle Brush your teeth every morning and night with fluoride toothpaste. Floss one time each day. Exercise for at least 30 minutes 5 or more days each week. Do not use any products that contain nicotine or tobacco. These products include cigarettes, chewing tobacco, and vaping devices, such as e-cigarettes. If you need help quitting, ask your health care provider. Do not use drugs. If you are sexually active, practice safe sex. Use a condom or other form of protection in order to  prevent STIs. Take aspirin only as told by your health care provider. Make sure that you understand how much to take and what form to take. Work with your  health care provider to find out whether it is safe and beneficial for you to take aspirin daily. Ask your health care provider if you need to take a cholesterol-lowering medicine (statin). Find healthy ways to manage stress, such as: Meditation, yoga, or listening to music. Journaling. Talking to a trusted person. Spending time with friends and family. Minimize exposure to UV radiation to reduce your risk of skin cancer. Safety Always wear your seat belt while driving or riding in a vehicle. Do not drive: If you have been drinking alcohol. Do not ride with someone who has been drinking. When you are tired or distracted. While texting. If you have been using any mind-altering substances or drugs. Wear a helmet and other protective equipment during sports activities. If you have firearms in your house, make sure you follow all gun safety procedures. What's next? Visit your health care provider once a year for an annual wellness visit. Ask your health care provider how often you should have your eyes and teeth checked. Stay up to date on all vaccines. This information is not intended to replace advice given to you by your health care provider. Make sure you discuss any questions you have with your health care provider. Document Revised: 08/06/2020 Document Reviewed: 08/06/2020 Elsevier Patient Education  2023 [employer].

## 2022-07-02 NOTE — Assessment & Plan Note (Signed)
Amlodipine reduced to 2.5mg .  Continue benazepril/hctz at current strength.

## 2022-07-02 NOTE — Progress Notes (Addendum)
Kristine Mclaughlin - 67 y.o. female MRN [id number]  Date of birth: May 26, 1955  Subjective Chief Complaint  Patient presents with   Hypertension   Hyperlipidemia    HPI Kristine Mclaughlin is a 67 y.o. female here today for annual exam.    She reports that she is doing pretty well.   She is doing well with chronic medications.  BP is stable at this time.    She remains moderately active.  She feels that diet is pretty good.   She has never had pneumonia vaccination.  She would like to decline this at this time. .  She is due for DEXA.    Review of Systems  Constitutional:  Negative for chills, fever, malaise/fatigue and weight loss.  HENT:  Negative for congestion, ear pain and sore throat.   Eyes:  Negative for blurred vision, double vision and pain.  Respiratory:  Negative for cough and shortness of breath.   Cardiovascular:  Negative for chest pain and palpitations.  Gastrointestinal:  Negative for abdominal pain, blood in stool, constipation, heartburn and nausea.  Genitourinary:  Negative for dysuria and urgency.  Musculoskeletal:  Negative for joint pain and myalgias.  Neurological:  Negative for dizziness and headaches.  Endo/Heme/Allergies:  Does not bruise/bleed easily.  Psychiatric/Behavioral:  Negative for depression. The patient is not nervous/anxious and does not have insomnia.     No Known Allergies  Past Medical History:  Diagnosis Date   Hyperlipidemia    Hypertension     Past Surgical History:  Procedure Laterality Date   BREAST BIOPSY     BREAST EXCISIONAL BIOPSY     BREAST SURGERY  1992   lumpectomy--no CA    Social History   Socioeconomic History   Marital status: Divorced    Spouse name: Not on file   Number of children: Not on file   Years of education: Not on file   Highest education level: Not on file  Occupational History    Employer: [employer]  Tobacco Use   Smoking status: Never    Passive exposure: Never   Smokeless tobacco:  Never  Substance and Sexual Activity   Alcohol use: Yes    Alcohol/week: 1.0 standard drink of alcohol    Types: 1 Cans of beer per week   Drug use: No   Sexual activity: Yes    Partners: Male  Other Topics Concern   Not on file  Social History Narrative   Exercise-- walks daily 3 miles   Social Determinants of Health   Financial Resource Strain: Not on file  Food Insecurity: Not on file  Transportation Needs: Not on file  Physical Activity: Sufficiently Active (11/01/2017)   Exercise Vital Sign    Days of Exercise per Week: 6 days    Minutes of Exercise per Session: 40 min  Stress: Not on file  Social Connections: Not on file    Family History  Problem Relation Age of Onset   Diabetes Father    Hyperlipidemia Father    Hypertension Father    Heart disease Father 55       1st MI--- triple bypass   Diabetes Brother    Heart disease Brother 72       quadruple bypass   Hyperlipidemia Brother    Hypertension Brother    Coronary artery disease Other     Health Maintenance  Topic Date Due   DEXA SCAN  Never done   COVID-19 Vaccine (5 - 2023-24 season) 12/18/2022 (  Originally 10/23/2021)   Pneumonia Vaccine 73+ Years old (1 of 1 - PCV) 07/02/2023 (Originally 10/16/2021)   INFLUENZA VACCINE  09/23/2022   PAP SMEAR-Modifier  12/01/2022   MAMMOGRAM  10/16/2023   DTaP/Tdap/Td (3 - Td or Tdap) 10/30/2024   COLONOSCOPY (Pts 45-64yrs Insurance coverage will need to be confirmed)  12/21/2026   Hepatitis C Screening  Completed   HIV Screening  Completed   Zoster Vaccines- Shingrix  Completed   HPV VACCINES  Aged Out     ----------------------------------------------------------------------------------------------------------------------------------------------------------------------------------------------------------------- Physical Exam BP 105/67 (BP Location: Left Arm, Patient Position: Sitting, Cuff Size: Normal)   Pulse 66   Ht 5\' 5"  (1.651 m)   Wt 153 lb (69.4 kg)    SpO2 97%   BMI 25.46 kg/m   Physical Exam Constitutional:      General: She is not in acute distress. HENT:     Head: Normocephalic and atraumatic.     Right Ear: Tympanic membrane and ear canal normal.     Left Ear: Tympanic membrane and ear canal normal.     Nose: Nose normal.  Eyes:     General: No scleral icterus.    Conjunctiva/sclera: Conjunctivae normal.  Neck:     Thyroid: No thyromegaly.  Cardiovascular:     Rate and Rhythm: Normal rate and regular rhythm.     Heart sounds: Normal heart sounds.  Pulmonary:     Effort: Pulmonary effort is normal.     Breath sounds: Normal breath sounds.  Abdominal:     General: Bowel sounds are normal. There is no distension.     Palpations: Abdomen is soft.     Tenderness: There is no abdominal tenderness. There is no guarding.  Musculoskeletal:        General: Normal range of motion.     Cervical back: Normal range of motion and neck supple.  Lymphadenopathy:     Cervical: No cervical adenopathy.  Skin:    General: Skin is warm and dry.     Findings: No rash.  Neurological:     General: No focal deficit present.     Mental Status: She is alert and oriented to person, place, and time.     Cranial Nerves: No cranial nerve deficit.     Coordination: Coordination normal.  Psychiatric:        Mood and Affect: Mood normal.        Behavior: Behavior normal.     ------------------------------------------------------------------------------------------------------------------------------------------------------------------------------------------------------------------- Assessment and Plan  Well adult exam Well adult Orders Placed This Encounter  Procedures   DG Bone Density    Standing Status:   Future    Standing Expiration Date:   07/02/2023    Order Specific Question:   Reason for Exam (SYMPTOM  OR DIAGNOSIS REQUIRED)    Answer:   Screening for osteoporosis, post-menopausal    Order Specific Question:   Preferred imaging  location?    Answer:   [facility]   COMPLETE METABOLIC PANEL WITH GFR   CBC with Differential   Lipid Panel w/reflex Direct LDL   TSH  Screenings: per lab orders Immunizations:  Declines pneumona vaccine Anticipatory guidance/Risk factor reduction:  Recommendations per AVS.   Essential hypertension Amlodipine reduced to 2.5mg .  Continue benazepril/hctz at current strength.    Tinnitus Referral entered to ENT.    Meds ordered this encounter  Medications   amLODipine (NORVASC) 2.5 MG tablet    Sig: TAKE 1 TABLET BY MOUTH DAILY.    Dispense:  90 tablet  Refill:  3   benazepril-hydrochlorthiazide (LOTENSIN HCT) 20-12.5 MG tablet    Sig: Take 1 tablet by mouth daily.    Dispense:  90 tablet    Refill:  3    Needs appointment.   celecoxib (CELEBREX) 200 MG capsule    Sig: One to 2 tablets by mouth daily as needed for pain.    Dispense:  60 capsule    Refill:  3   ezetimibe (ZETIA) 10 MG tablet    Sig: TAKE 1 TABLET(10 MG) BY MOUTH DAILY    Dispense:  90 tablet    Refill:  3   rosuvastatin (CRESTOR) 40 MG tablet    Sig: Take 1 tablet by mouth daily.    Dispense:  90 tablet    Refill:  3    Return in about 6 months (around 01/02/2023) for HTN.    This visit occurred during the SARS-CoV-2 public health emergency.  Safety protocols were in place, including screening questions prior to the visit, additional usage of staff PPE, and extensive cleaning of exam room while observing appropriate contact time as indicated for disinfecting solutions.

## 2022-07-02 NOTE — Assessment & Plan Note (Signed)
-  Referral entered to ENT 

## 2022-07-02 NOTE — Addendum Note (Signed)
Addended by: Mammie Lorenzo on: 07/02/2022 08:56 AM   Modules accepted: Orders

## 2022-07-02 NOTE — Assessment & Plan Note (Signed)
Well adult Orders Placed This Encounter  Procedures   DG Bone Density    Standing Status:   Future    Standing Expiration Date:   07/02/2023    Order Specific Question:   Reason for Exam (SYMPTOM  OR DIAGNOSIS REQUIRED)    Answer:   Screening for osteoporosis, post-menopausal    Order Specific Question:   Preferred imaging location?    Answer:   [facility]   COMPLETE METABOLIC PANEL WITH GFR   CBC with Differential   Lipid Panel w/reflex Direct LDL   TSH  Screenings: per lab orders Immunizations:  Declines pneumona vaccine Anticipatory guidance/Risk factor reduction:  Recommendations per AVS.

## 2022-07-03 LAB — COMPLETE METABOLIC PANEL WITH GFR
AG Ratio: 1.8 (calc) (ref 1.0–2.5)
ALT: 26 U/L (ref 6–29)
AST: 39 U/L — ABNORMAL HIGH (ref 10–35)
Albumin: 4.4 g/dL (ref 3.6–5.1)
Alkaline phosphatase (APISO): 42 U/L (ref 37–153)
BUN: 10 mg/dL (ref 7–25)
CO2: 24 mmol/L (ref 20–32)
Calcium: 9.3 mg/dL (ref 8.6–10.4)
Chloride: 105 mmol/L (ref 98–110)
Creat: 0.84 mg/dL (ref 0.50–1.05)
Globulin: 2.4 g/dL (calc) (ref 1.9–3.7)
Glucose, Bld: 106 mg/dL — ABNORMAL HIGH (ref 65–99)
Potassium: 4.2 mmol/L (ref 3.5–5.3)
Sodium: 140 mmol/L (ref 135–146)
Total Bilirubin: 1 mg/dL (ref 0.2–1.2)
Total Protein: 6.8 g/dL (ref 6.1–8.1)
eGFR: 77 mL/min/{1.73_m2} (ref >=60)

## 2022-07-03 LAB — CBC WITH DIFFERENTIAL/PLATELET
Absolute Monocytes: 309 cells/uL (ref 200–950)
Basophils Relative: 0.6 %
Eosinophils Absolute: 181 cells/uL (ref 15–500)
Eosinophils Relative: 3.7 %
HCT: 41 % (ref 35.0–45.0)
Hemoglobin: 13 g/dL (ref 11.7–15.5)
Lymphs Abs: 1568 cells/uL (ref 850–3900)
MCH: 28.4 pg (ref 27.0–33.0)
MCHC: 31.7 g/dL — ABNORMAL LOW (ref 32.0–36.0)
MCV: 89.7 fL (ref 80.0–100.0)
MPV: 12 fL (ref 7.5–12.5)
Monocytes Relative: 6.3 %
Neutrophils Relative %: 57.4 %
Platelets: 235 10*3/uL (ref 140–400)
Total Lymphocyte: 32 %
WBC: 4.9 10*3/uL (ref 3.8–10.8)

## 2022-07-03 LAB — LIPID PANEL W/REFLEX DIRECT LDL
Cholesterol: 213 mg/dL — ABNORMAL HIGH (ref <200)
HDL: 69 mg/dL (ref >=50)
LDL Cholesterol (Calc): 124 mg/dL (calc) — ABNORMAL HIGH
Non-HDL Cholesterol (Calc): 144 mg/dL (calc) — ABNORMAL HIGH (ref <130)
Total CHOL/HDL Ratio: 3.1 (calc) (ref <5.0)
Triglycerides: 94 mg/dL (ref <150)

## 2022-07-03 LAB — TSH: TSH: 0.27 mIU/L — ABNORMAL LOW (ref 0.40–4.50)

## 2022-10-20 ENCOUNTER — Ambulatory Visit: Payer: Federal, State, Local not specified - PPO

## 2022-10-20 ENCOUNTER — Other Ambulatory Visit: Payer: Federal, State, Local not specified - PPO

## 2022-11-10 ENCOUNTER — Ambulatory Visit (INDEPENDENT_AMBULATORY_CARE_PROVIDER_SITE_OTHER): Payer: Federal, State, Local not specified - PPO

## 2022-11-10 DIAGNOSIS — Z1231 Encounter for screening mammogram for malignant neoplasm of breast: Secondary | ICD-10-CM | POA: Diagnosis not present

## 2022-11-10 DIAGNOSIS — H903 Sensorineural hearing loss, bilateral: Secondary | ICD-10-CM | POA: Diagnosis not present

## 2022-11-10 DIAGNOSIS — H9312 Tinnitus, left ear: Secondary | ICD-10-CM | POA: Diagnosis not present

## 2022-11-17 ENCOUNTER — Ambulatory Visit (INDEPENDENT_AMBULATORY_CARE_PROVIDER_SITE_OTHER): Payer: Federal, State, Local not specified - PPO

## 2022-11-17 DIAGNOSIS — Z780 Asymptomatic menopausal state: Secondary | ICD-10-CM | POA: Diagnosis not present

## 2022-11-17 DIAGNOSIS — Z13820 Encounter for screening for osteoporosis: Secondary | ICD-10-CM

## 2022-11-17 DIAGNOSIS — M8588 Other specified disorders of bone density and structure, other site: Secondary | ICD-10-CM | POA: Diagnosis not present

## 2022-11-19 ENCOUNTER — Encounter: Payer: Self-pay | Admitting: Family Medicine

## 2022-11-19 DIAGNOSIS — M8580 Other specified disorders of bone density and structure, unspecified site: Secondary | ICD-10-CM | POA: Insufficient documentation

## 2022-11-19 NOTE — Progress Notes (Signed)
Hi Kristine Mclaughlin, I am covering for Dr. Ashley Royalty while he is out.  Your bone density test shows a T-score of -1.8 consistent with mildly thin bones called osteopenia.   The current recommendation for osteopenia (mildly thin bones) treatment includes:   #1 calcium-total of 1200 mg of calcium daily.  If you eat a very calcium rich diet you may be able to obtain that without a supplement.  If not, then I recommend calcium 500 mg twice a day.  There are several products over-the-counter such as Caltrate D and Viactiv chews which are great options that contain calcium and vitamin D. #2 vitamin D-recommend 800 international units daily. #3 exercise-recommend 30 minutes of weightbearing exercise 3 days a week.  Resistance training ,such as doing bands and light weights, can be particularly helpful.

## 2023-01-07 ENCOUNTER — Ambulatory Visit: Payer: Federal, State, Local not specified - PPO | Admitting: Family Medicine

## 2023-02-07 ENCOUNTER — Ambulatory Visit: Payer: Federal, State, Local not specified - PPO | Admitting: Family Medicine

## 2023-03-03 ENCOUNTER — Ambulatory Visit: Payer: Federal, State, Local not specified - PPO | Admitting: Family Medicine

## 2023-03-14 ENCOUNTER — Ambulatory Visit: Payer: Federal, State, Local not specified - PPO | Admitting: Family Medicine

## 2023-03-14 ENCOUNTER — Encounter: Payer: Self-pay | Admitting: Family Medicine

## 2023-03-14 VITALS — BP 119/74 | HR 66 | Temp 98.5°F | Ht 65.5 in | Wt 158.1 lb

## 2023-03-14 DIAGNOSIS — I10 Essential (primary) hypertension: Secondary | ICD-10-CM | POA: Diagnosis not present

## 2023-03-14 DIAGNOSIS — E041 Nontoxic single thyroid nodule: Secondary | ICD-10-CM

## 2023-03-14 DIAGNOSIS — E785 Hyperlipidemia, unspecified: Secondary | ICD-10-CM

## 2023-03-14 DIAGNOSIS — M8580 Other specified disorders of bone density and structure, unspecified site: Secondary | ICD-10-CM | POA: Diagnosis not present

## 2023-03-14 NOTE — Assessment & Plan Note (Signed)
Tolerating crestor well.  Updating lipid panel.

## 2023-03-14 NOTE — Progress Notes (Signed)
Kristine Mclaughlin - 68 y.o. female MRN [id number]  Date of birth: 05-Apr-1955  Subjective Chief Complaint  Patient presents with   Blood Pressure Check    HPI Kristine Mclaughlin is a 68 y.o. female here today for follow up vitis.   She reports that she is doing well.  She has noticed buzzing/humming sensation in L ear.  She did see ENT but was not satisfied after this appt.  Her hearing test was normal.     She continues on benazepril/hydrochlorothiazide and amlodipine for management of HTN.  BP is well controlled.  She denies side effects from medication at current strength.     Tolerating crestor and zetia well for management of HLD.    ROS:  A comprehensive ROS was completed and negative except as noted per HPI  No Known Allergies  Past Medical History:  Diagnosis Date   Hyperlipidemia    Hypertension     Past Surgical History:  Procedure Laterality Date   BREAST BIOPSY     BREAST EXCISIONAL BIOPSY     BREAST SURGERY  1992   lumpectomy--no CA    Social History   Socioeconomic History   Marital status: Divorced    Spouse name: Not on file   Number of children: Not on file   Years of education: Not on file   Highest education level: Not on file  Occupational History    Employer: [employer]  Tobacco Use   Smoking status: Never    Passive exposure: Never   Smokeless tobacco: Never  Substance and Sexual Activity   Alcohol use: Yes    Alcohol/week: 1.0 standard drink of alcohol    Types: 1 Cans of beer per week   Drug use: No   Sexual activity: Yes    Partners: Male  Other Topics Concern   Not on file  Social History Narrative   Exercise-- walks daily 3 miles   Social Drivers of Health   Financial Resource Strain: Not on file  Food Insecurity: Not on file  Transportation Needs: Not on file  Physical Activity: Sufficiently Active (11/01/2017)   Exercise Vital Sign    Days of Exercise per Week: 6 days    Minutes of Exercise per Session: 40 min  Stress: Not  on file  Social Connections: Unknown (07/03/2021)   Received from [facility]   Social Network    Social Network: Not on file    Family History  Problem Relation Age of Onset   Diabetes Father    Hyperlipidemia Father    Hypertension Father    Heart disease Father 42       1st MI--- triple bypass   Diabetes Brother    Heart disease Brother 47       quadruple bypass   Hyperlipidemia Brother    Hypertension Brother    Coronary artery disease Other     Health Maintenance  Topic Date Due   COVID-19 Vaccine (6 - 2024-25 season) 03/30/2023 (Originally 02/18/2023)   INFLUENZA VACCINE  05/23/2023 (Originally 09/23/2022)   Pneumonia Vaccine 47+ Years old (1 of 1 - PCV) 07/02/2023 (Originally 10/16/2021)   DTaP/Tdap/Td (3 - Td or Tdap) 10/30/2024   MAMMOGRAM  11/09/2024   Colonoscopy  12/21/2026   DEXA SCAN  Completed   Hepatitis C Screening  Completed   Zoster Vaccines- Shingrix  Completed   HPV VACCINES  Aged Out     ----------------------------------------------------------------------------------------------------------------------------------------------------------------------------------------------------------------- Physical Exam BP 119/74 (BP Location: Left Arm, Patient  Position: Sitting)   Pulse 66   Temp 98.5 F (36.9 C) (Oral)   Ht 5' 5.5" (1.664 m)   Wt 158 lb 1.9 oz (71.7 kg)   BMI 25.91 kg/m   Physical Exam Constitutional:      Appearance: Normal appearance.  Eyes:     General: No scleral icterus. Cardiovascular:     Rate and Rhythm: Normal rate and regular rhythm.  Pulmonary:     Effort: Pulmonary effort is normal.     Breath sounds: Normal breath sounds.  Neurological:     Mental Status: She is alert.  Psychiatric:        Mood and Affect: Mood normal.        Behavior: Behavior normal.      ------------------------------------------------------------------------------------------------------------------------------------------------------------------------------------------------------------------- Assessment and Plan  Dyslipidemia Tolerating crestor well. Updating lipid panel.  Essential hypertension Doing well with current medications.  Will plan to continue at current strength.   Thyroid nodule TSH abnormal at last visit. Updating Thyroid function tests.    No orders of the defined types were placed in this encounter.   No follow-ups on file.    This visit occurred during the SARS-CoV-2 public health emergency.  Safety protocols were in place, including screening questions prior to the visit, additional usage of staff PPE, and extensive cleaning of exam room while observing appropriate contact time as indicated for disinfecting solutions.

## 2023-03-14 NOTE — Patient Instructions (Addendum)
Take 1000-1200mg  of calcium each day.  Continue vitamin d supplementaion Try adding a B complex vitamin

## 2023-03-14 NOTE — Assessment & Plan Note (Signed)
TSH abnormal at last visit. Updating Thyroid function tests.

## 2023-03-14 NOTE — Addendum Note (Signed)
Addended by: Mammie Lorenzo on: 03/14/2023 08:39 AM   Modules accepted: Orders

## 2023-03-14 NOTE — Assessment & Plan Note (Signed)
Doing well with current medications.  Will plan to continue at current strength.

## 2023-03-15 LAB — CMP14+EGFR
ALT: 30 [IU]/L (ref 0–32)
AST: 32 [IU]/L (ref 0–40)
Albumin: 4.4 g/dL (ref 3.9–4.9)
Alkaline Phosphatase: 60 [IU]/L (ref 44–121)
BUN/Creatinine Ratio: 11 — ABNORMAL LOW (ref 12–28)
BUN: 10 mg/dL (ref 8–27)
Bilirubin Total: 0.4 mg/dL (ref 0.0–1.2)
CO2: 25 mmol/L (ref 20–29)
Calcium: 9.4 mg/dL (ref 8.7–10.3)
Chloride: 101 mmol/L (ref 96–106)
Creatinine, Ser: 0.94 mg/dL (ref 0.57–1.00)
Globulin, Total: 2.2 g/dL (ref 1.5–4.5)
Glucose: 104 mg/dL — ABNORMAL HIGH (ref 70–99)
Potassium: 4.2 mmol/L (ref 3.5–5.2)
Sodium: 141 mmol/L (ref 134–144)
Total Protein: 6.6 g/dL (ref 6.0–8.5)
eGFR: 67 mL/min/{1.73_m2} (ref >59)

## 2023-03-15 LAB — LIPID PANEL WITH LDL/HDL RATIO
Cholesterol, Total: 205 mg/dL — ABNORMAL HIGH (ref 100–199)
HDL: 72 mg/dL (ref >39)
LDL Chol Calc (NIH): 121 mg/dL — ABNORMAL HIGH (ref 0–99)
LDL/HDL Ratio: 1.7 {ratio} (ref 0.0–3.2)
Triglycerides: 65 mg/dL (ref 0–149)
VLDL Cholesterol Cal: 12 mg/dL (ref 5–40)

## 2023-03-15 LAB — CBC WITH DIFFERENTIAL/PLATELET
Basophils Absolute: 0 10*3/uL (ref 0.0–0.2)
Basos: 1 %
EOS (ABSOLUTE): 0.2 10*3/uL (ref 0.0–0.4)
Eos: 3 %
Hematocrit: 41 % (ref 34.0–46.6)
Hemoglobin: 13.2 g/dL (ref 11.1–15.9)
Immature Grans (Abs): 0 10*3/uL (ref 0.0–0.1)
Immature Granulocytes: 0 %
Lymphocytes Absolute: 1.9 10*3/uL (ref 0.7–3.1)
Lymphs: 31 %
MCH: 28.9 pg (ref 26.6–33.0)
MCHC: 32.2 g/dL (ref 31.5–35.7)
MCV: 90 fL (ref 79–97)
Monocytes Absolute: 0.4 10*3/uL (ref 0.1–0.9)
Monocytes: 7 %
Neutrophils Absolute: 3.6 10*3/uL (ref 1.4–7.0)
Neutrophils: 58 %
Platelets: 246 10*3/uL (ref 150–450)
RBC: 4.57 x10E6/uL (ref 3.77–5.28)
RDW: 12.5 % (ref 11.7–15.4)
WBC: 6.2 10*3/uL (ref 3.4–10.8)

## 2023-03-15 LAB — TSH+FREE T4
Free T4: 1.07 ng/dL (ref 0.82–1.77)
TSH: 0.332 u[IU]/mL — ABNORMAL LOW (ref 0.450–4.500)

## 2023-03-15 LAB — VITAMIN D 25 HYDROXY (VIT D DEFICIENCY, FRACTURES): Vit D, 25-Hydroxy: 59.2 ng/mL (ref 30.0–100.0)

## 2023-03-25 ENCOUNTER — Encounter: Payer: Self-pay | Admitting: Family Medicine

## 2023-03-28 ENCOUNTER — Encounter: Payer: Self-pay | Admitting: Family Medicine

## 2023-07-09 ENCOUNTER — Other Ambulatory Visit: Payer: Self-pay | Admitting: Family Medicine

## 2023-07-09 DIAGNOSIS — I10 Essential (primary) hypertension: Secondary | ICD-10-CM

## 2023-07-23 ENCOUNTER — Other Ambulatory Visit: Payer: Self-pay | Admitting: Family Medicine

## 2023-07-23 DIAGNOSIS — E785 Hyperlipidemia, unspecified: Secondary | ICD-10-CM

## 2023-09-12 ENCOUNTER — Ambulatory Visit: Payer: Federal, State, Local not specified - PPO | Admitting: Family Medicine

## 2023-09-20 ENCOUNTER — Other Ambulatory Visit: Payer: Self-pay | Admitting: Family Medicine

## 2023-09-20 DIAGNOSIS — E785 Hyperlipidemia, unspecified: Secondary | ICD-10-CM

## 2023-10-07 ENCOUNTER — Other Ambulatory Visit: Payer: Self-pay | Admitting: Family Medicine

## 2023-10-07 DIAGNOSIS — I10 Essential (primary) hypertension: Secondary | ICD-10-CM

## 2023-10-25 ENCOUNTER — Encounter: Payer: Self-pay | Admitting: Sports Medicine

## 2023-11-08 ENCOUNTER — Ambulatory Visit: Admitting: Family Medicine

## 2023-11-08 ENCOUNTER — Other Ambulatory Visit: Payer: Self-pay | Admitting: Family Medicine

## 2023-11-08 DIAGNOSIS — Z1231 Encounter for screening mammogram for malignant neoplasm of breast: Secondary | ICD-10-CM

## 2023-11-14 ENCOUNTER — Ambulatory Visit: Admitting: Family Medicine

## 2023-11-14 ENCOUNTER — Encounter: Payer: Self-pay | Admitting: Family Medicine

## 2023-11-14 VITALS — BP 110/70 | HR 56 | Ht 65.5 in | Wt 146.0 lb

## 2023-11-14 DIAGNOSIS — I10 Essential (primary) hypertension: Secondary | ICD-10-CM | POA: Diagnosis not present

## 2023-11-14 DIAGNOSIS — E041 Nontoxic single thyroid nodule: Secondary | ICD-10-CM | POA: Diagnosis not present

## 2023-11-14 DIAGNOSIS — E785 Hyperlipidemia, unspecified: Secondary | ICD-10-CM

## 2023-11-14 NOTE — Assessment & Plan Note (Signed)
 Doing well with current medications.  Will plan to continue at current strength.

## 2023-11-14 NOTE — Progress Notes (Signed)
 Kristine Mclaughlin - 68 y.o. female MRN [id number]  Date of birth: Mar 08, 1955  Subjective Chief Complaint  Patient presents with   Hypertension    HPI Kristine Mclaughlin is a 68 y.o. female here today for follow up visit.   She reports that she is doing well.   She continues on benazepril /hydrochlorothiazide  and amlodipne for management of HTN.  BP is well controlled with current strength of medications.  She denies chest pain, shortness of breath, palpitations, headache or vision changes.   Remains on crestor  and zetia  for HLD.  She is tolerating this well at current strength.  She is working on dietary changes to help with this as well.   ROS:  A comprehensive ROS was completed and negative except as noted per HPI  No Known Allergies  Past Medical History:  Diagnosis Date   Hyperlipidemia    Hypertension     Past Surgical History:  Procedure Laterality Date   BREAST BIOPSY     BREAST EXCISIONAL BIOPSY     BREAST SURGERY  1992   lumpectomy--no CA    Social History   Socioeconomic History   Marital status: Divorced    Spouse name: Not on file   Number of children: Not on file   Years of education: Not on file   Highest education level: Not on file  Occupational History    Employer: [employer]  Tobacco Use   Smoking status: Never    Passive exposure: Never   Smokeless tobacco: Never  Substance and Sexual Activity   Alcohol use: Yes    Alcohol/week: 1.0 standard drink of alcohol    Types: 1 Cans of beer per week   Drug use: No   Sexual activity: Yes    Partners: Male  Other Topics Concern   Not on file  Social History Narrative   Exercise-- walks daily 3 miles   Social Drivers of Health   Financial Resource Strain: Low Risk  (11/14/2023)   Overall Financial Resource Strain (CARDIA)    Difficulty of Paying Living Expenses: Not hard at all  Food Insecurity: No Food Insecurity (11/14/2023)   Hunger Vital Sign    Worried About Running Out of Food in the Last  Year: Never true    Ran Out of Food in the Last Year: Never true  Transportation Needs: No Transportation Needs (11/14/2023)   PRAPARE - Administrator, Civil Service (Medical): No    Lack of Transportation (Non-Medical): No  Physical Activity: Sufficiently Active (11/14/2023)   Exercise Vital Sign    Days of Exercise per Week: 5 days    Minutes of Exercise per Session: 30 min  Stress: No Stress Concern Present (11/14/2023)   [employer] of Occupational Health - Occupational Stress Questionnaire    Feeling of Stress: Not at all  Social Connections: Moderately Integrated (11/14/2023)   Social Connection and Isolation Panel    Frequency of Communication with Friends and Family: Once a week    Frequency of Social Gatherings with Friends and Family: Once a week    Attends Religious Services: 1 to 4 times per year    Active Member of [employer] or Organizations: Yes    Attends Banker Meetings: 1 to 4 times per year    Marital Status: Married    Family History  Problem Relation Age of Onset   Diabetes Father    Hyperlipidemia Father    Hypertension Father    Heart disease Father 61  1st MI--- triple bypass   Diabetes Brother    Heart disease Brother 49       quadruple bypass   Hyperlipidemia Brother    Hypertension Brother    Coronary artery disease Other     Health Maintenance  Topic Date Due   Pneumococcal Vaccine: 50+ Years (1 of 1 - PCV) Never done   Influenza Vaccine  09/23/2023   COVID-19 Vaccine (6 - 2025-26 season) 10/24/2023   DTaP/Tdap/Td (3 - Td or Tdap) 10/30/2024   Mammogram  11/09/2024   Colonoscopy  12/21/2026   DEXA SCAN  Completed   Hepatitis C Screening  Completed   Zoster Vaccines- Shingrix   Completed   HPV VACCINES  Aged Out   Meningococcal B Vaccine  Aged Out      ----------------------------------------------------------------------------------------------------------------------------------------------------------------------------------------------------------------- Physical Exam BP 110/70 (BP Location: Left Arm, Patient Position: Sitting, Cuff Size: Normal)   Pulse (!) 56   Ht 5' 5.5 (1.664 m)   Wt 146 lb (66.2 kg)   SpO2 99%   BMI 23.93 kg/m   Physical Exam Constitutional:      Appearance: Normal appearance.  Eyes:     General: No scleral icterus. Cardiovascular:     Rate and Rhythm: Normal rate and regular rhythm.  Pulmonary:     Effort: Pulmonary effort is normal.     Breath sounds: Normal breath sounds.  Neurological:     General: No focal deficit present.     Mental Status: She is alert.  Psychiatric:        Mood and Affect: Mood normal.        Behavior: Behavior normal.     ------------------------------------------------------------------------------------------------------------------------------------------------------------------------------------------------------------------- Assessment and Plan  Essential hypertension Doing well with current medications.  Will plan to continue at current strength.   Thyroid  nodule TSH abnormal at last visit. Updating Thyroid  function tests.   Dyslipidemia Tolerating crestor  well. Updating lipid panel.   No orders of the defined types were placed in this encounter.   No follow-ups on file.

## 2023-11-14 NOTE — Assessment & Plan Note (Signed)
Tolerating crestor well.  Updating lipid panel.

## 2023-11-14 NOTE — Assessment & Plan Note (Signed)
 TSH abnormal at last visit. Updating Thyroid function tests.

## 2023-11-15 ENCOUNTER — Ambulatory Visit: Admitting: Family Medicine

## 2023-11-15 LAB — CBC WITH DIFFERENTIAL/PLATELET
Basophils Absolute: 0 x10E3/uL (ref 0.0–0.2)
Basos: 0 %
EOS (ABSOLUTE): 0.2 x10E3/uL (ref 0.0–0.4)
Eos: 3 %
Hematocrit: 42.7 % (ref 34.0–46.6)
Hemoglobin: 14 g/dL (ref 11.1–15.9)
Immature Grans (Abs): 0 x10E3/uL (ref 0.0–0.1)
Immature Granulocytes: 0 %
Lymphocytes Absolute: 1.6 x10E3/uL (ref 0.7–3.1)
Lymphs: 32 %
MCH: 29.6 pg (ref 26.6–33.0)
MCHC: 32.8 g/dL (ref 31.5–35.7)
MCV: 90 fL (ref 79–97)
Monocytes Absolute: 0.3 x10E3/uL (ref 0.1–0.9)
Monocytes: 6 %
Neutrophils Absolute: 2.9 x10E3/uL (ref 1.4–7.0)
Neutrophils: 59 %
Platelets: 208 x10E3/uL (ref 150–450)
RBC: 4.73 x10E6/uL (ref 3.77–5.28)
RDW: 13.2 % (ref 11.7–15.4)
WBC: 5 x10E3/uL (ref 3.4–10.8)

## 2023-11-15 LAB — CMP14+EGFR
ALT: 50 IU/L — ABNORMAL HIGH (ref 0–32)
AST: 47 IU/L — ABNORMAL HIGH (ref 0–40)
Albumin: 4.6 g/dL (ref 3.9–4.9)
Alkaline Phosphatase: 61 IU/L (ref 49–135)
BUN/Creatinine Ratio: 10 — ABNORMAL LOW (ref 12–28)
BUN: 10 mg/dL (ref 8–27)
Bilirubin Total: 0.8 mg/dL (ref 0.0–1.2)
CO2: 24 mmol/L (ref 20–29)
Calcium: 9.6 mg/dL (ref 8.7–10.3)
Chloride: 100 mmol/L (ref 96–106)
Creatinine, Ser: 0.96 mg/dL (ref 0.57–1.00)
Globulin, Total: 2.6 g/dL (ref 1.5–4.5)
Glucose: 104 mg/dL — ABNORMAL HIGH (ref 70–99)
Potassium: 4.1 mmol/L (ref 3.5–5.2)
Sodium: 140 mmol/L (ref 134–144)
Total Protein: 7.2 g/dL (ref 6.0–8.5)
eGFR: 65 mL/min/1.73

## 2023-11-15 LAB — LIPID PANEL WITH LDL/HDL RATIO
Cholesterol, Total: 216 mg/dL — ABNORMAL HIGH (ref 100–199)
HDL: 78 mg/dL (ref >39)
LDL Chol Calc (NIH): 124 mg/dL — ABNORMAL HIGH (ref 0–99)
LDL/HDL Ratio: 1.6 ratio (ref 0.0–3.2)
Triglycerides: 79 mg/dL (ref 0–149)
VLDL Cholesterol Cal: 14 mg/dL (ref 5–40)

## 2023-11-15 LAB — TSH+FREE T4
Free T4: 1.04 ng/dL (ref 0.82–1.77)
TSH: 0.266 u[IU]/mL — ABNORMAL LOW (ref 0.450–4.500)

## 2023-11-17 ENCOUNTER — Encounter (HOSPITAL_BASED_OUTPATIENT_CLINIC_OR_DEPARTMENT_OTHER): Payer: Self-pay

## 2023-11-17 ENCOUNTER — Ambulatory Visit (HOSPITAL_BASED_OUTPATIENT_CLINIC_OR_DEPARTMENT_OTHER)
Admission: RE | Admit: 2023-11-17 | Discharge: 2023-11-17 | Disposition: A | Discharge status: Home / Self Care | Source: Ambulatory Visit | Attending: Family Medicine | Admitting: Family Medicine

## 2023-11-17 DIAGNOSIS — Z1231 Encounter for screening mammogram for malignant neoplasm of breast: Secondary | ICD-10-CM | POA: Diagnosis present

## 2023-11-28 ENCOUNTER — Ambulatory Visit: Payer: Self-pay | Admitting: Family Medicine

## 2023-12-06 ENCOUNTER — Other Ambulatory Visit: Payer: Self-pay | Admitting: Family Medicine

## 2023-12-06 DIAGNOSIS — I10 Essential (primary) hypertension: Secondary | ICD-10-CM

## 2023-12-18 ENCOUNTER — Other Ambulatory Visit: Payer: Self-pay | Admitting: Family Medicine

## 2023-12-18 DIAGNOSIS — E785 Hyperlipidemia, unspecified: Secondary | ICD-10-CM

## 2024-02-08 ENCOUNTER — Ambulatory Visit

## 2024-05-14 ENCOUNTER — Ambulatory Visit: Admitting: Family Medicine
